# Patient Record
Sex: Male | Born: 1960 | Race: White | Hispanic: No | Marital: Single | Smoking: Never smoker
Health system: Southern US, Community
[De-identification: ages and names within clinical notes are randomized; demographics above are authoritative.]

## PROBLEM LIST (undated history)

## (undated) DIAGNOSIS — Z87442 Personal history of urinary calculi: Secondary | ICD-10-CM

## (undated) DIAGNOSIS — I10 Essential (primary) hypertension: Secondary | ICD-10-CM

## (undated) DIAGNOSIS — M109 Gout, unspecified: Secondary | ICD-10-CM

## (undated) DIAGNOSIS — K5792 Diverticulitis of intestine, part unspecified, without perforation or abscess without bleeding: Secondary | ICD-10-CM

## (undated) DIAGNOSIS — M1990 Unspecified osteoarthritis, unspecified site: Secondary | ICD-10-CM

## (undated) DIAGNOSIS — E663 Overweight: Secondary | ICD-10-CM

## (undated) DIAGNOSIS — E785 Hyperlipidemia, unspecified: Secondary | ICD-10-CM

## (undated) DIAGNOSIS — G4730 Sleep apnea, unspecified: Secondary | ICD-10-CM

## (undated) HISTORY — DX: Diverticulitis of intestine, part unspecified, without perforation or abscess without bleeding: K57.92

## (undated) HISTORY — PX: OTHER SURGICAL HISTORY: SHX169

## (undated) HISTORY — DX: Overweight: E66.3

## (undated) HISTORY — DX: Gout, unspecified: M10.9

## (undated) HISTORY — PX: APPENDECTOMY: SHX54

## (undated) HISTORY — PX: COLONOSCOPY: SHX174

## (undated) HISTORY — DX: Hyperlipidemia, unspecified: E78.5

---
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
## 2004-01-29 ENCOUNTER — Emergency Department: Payer: Self-pay | Admitting: Emergency Medicine

## 2010-05-31 ENCOUNTER — Ambulatory Visit: Payer: Self-pay | Admitting: Unknown Physician Specialty

## 2014-07-24 ENCOUNTER — Inpatient Hospital Stay
Admit: 2014-07-24 | Disposition: A | Discharge status: Home / Self Care | Payer: Self-pay | Attending: Surgery | Admitting: Surgery

## 2014-07-24 LAB — URINALYSIS, COMPLETE
BACTERIA: NONE SEEN
Bilirubin,UR: NEGATIVE
GLUCOSE, UR: NEGATIVE mg/dL (ref 0–75)
Ketone: NEGATIVE
LEUKOCYTE ESTERASE: NEGATIVE
NITRITE: NEGATIVE
Ph: 6 (ref 4.5–8.0)
Protein: NEGATIVE
Specific Gravity: 1.015 (ref 1.003–1.030)
Squamous Epithelial: NONE SEEN

## 2014-07-24 LAB — COMPREHENSIVE METABOLIC PANEL
ALK PHOS: 55 U/L
ANION GAP: 10 (ref 7–16)
Albumin: 3.7 g/dL
BUN: 17 mg/dL
Bilirubin,Total: 1.6 mg/dL — ABNORMAL HIGH
CALCIUM: 8.6 mg/dL — AB
CO2: 25 mmol/L
Chloride: 97 mmol/L — ABNORMAL LOW
Creatinine: 1.24 mg/dL
EGFR (African American): >60
EGFR (Non-African Amer.): >60
GLUCOSE: 137 mg/dL — AB
POTASSIUM: 3.5 mmol/L
SGOT(AST): 21 U/L
SGPT (ALT): 23 U/L
SODIUM: 132 mmol/L — AB
Total Protein: 7.4 g/dL

## 2014-07-24 LAB — CBC WITH DIFFERENTIAL/PLATELET
Basophil #: 0.1 10*3/uL (ref 0.0–0.1)
Basophil %: 0.3 %
Eosinophil #: 0 10*3/uL (ref 0.0–0.7)
Eosinophil %: 0.2 %
HCT: 43.7 % (ref 40.0–52.0)
HGB: 14.6 g/dL (ref 13.0–18.0)
LYMPHS ABS: 1.8 10*3/uL (ref 1.0–3.6)
Lymphocyte %: 10.7 %
MCH: 29.5 pg (ref 26.0–34.0)
MCHC: 33.4 g/dL (ref 32.0–36.0)
MCV: 88 fL (ref 80–100)
Monocyte #: 1.6 x10 3/mm — ABNORMAL HIGH (ref 0.2–1.0)
Monocyte %: 9.4 %
Neutrophil #: 13.4 10*3/uL — ABNORMAL HIGH (ref 1.4–6.5)
Neutrophil %: 79.4 %
Platelet: 252 10*3/uL (ref 150–440)
RBC: 4.95 10*6/uL (ref 4.40–5.90)
RDW: 12.9 % (ref 11.5–14.5)
WBC: 16.9 10*3/uL — ABNORMAL HIGH (ref 3.8–10.6)

## 2014-07-24 LAB — LIPASE, BLOOD: Lipase: 27 U/L

## 2014-07-25 LAB — CBC WITH DIFFERENTIAL/PLATELET
BASOS ABS: 0.1 10*3/uL (ref 0.0–0.1)
Basophil %: 0.4 %
Eosinophil #: 0.1 10*3/uL (ref 0.0–0.7)
Eosinophil %: 0.9 %
HCT: 40.7 % (ref 40.0–52.0)
HGB: 13.5 g/dL (ref 13.0–18.0)
Lymphocyte #: 1.6 10*3/uL (ref 1.0–3.6)
Lymphocyte %: 11.7 %
MCH: 29.8 pg (ref 26.0–34.0)
MCHC: 33.2 g/dL (ref 32.0–36.0)
MCV: 90 fL (ref 80–100)
MONOS PCT: 9.8 %
Monocyte #: 1.3 x10 3/mm — ABNORMAL HIGH (ref 0.2–1.0)
NEUTROS PCT: 77.2 %
Neutrophil #: 10.6 10*3/uL — ABNORMAL HIGH (ref 1.4–6.5)
PLATELETS: 213 10*3/uL (ref 150–440)
RBC: 4.54 10*6/uL (ref 4.40–5.90)
RDW: 12.7 % (ref 11.5–14.5)
WBC: 13.7 10*3/uL — AB (ref 3.8–10.6)

## 2014-07-25 LAB — BASIC METABOLIC PANEL
Anion Gap: 8 (ref 7–16)
BUN: 16 mg/dL
CO2: 27 mmol/L
CREATININE: 1.28 mg/dL — AB
Calcium, Total: 7.9 mg/dL — ABNORMAL LOW
Chloride: 99 mmol/L — ABNORMAL LOW
EGFR (African American): >60
EGFR (Non-African Amer.): >60
Glucose: 127 mg/dL — ABNORMAL HIGH
Potassium: 3.4 mmol/L — ABNORMAL LOW
Sodium: 134 mmol/L — ABNORMAL LOW

## 2014-07-27 LAB — CBC WITH DIFFERENTIAL/PLATELET
BASOS PCT: 0.8 %
Basophil #: 0.1 10*3/uL (ref 0.0–0.1)
EOS PCT: 3.1 %
Eosinophil #: 0.3 10*3/uL (ref 0.0–0.7)
HCT: 38.7 % — AB (ref 40.0–52.0)
HGB: 12.9 g/dL — ABNORMAL LOW (ref 13.0–18.0)
Lymphocyte #: 1.9 10*3/uL (ref 1.0–3.6)
Lymphocyte %: 19 %
MCH: 30.1 pg (ref 26.0–34.0)
MCHC: 33.4 g/dL (ref 32.0–36.0)
MCV: 90 fL (ref 80–100)
MONO ABS: 1 x10 3/mm (ref 0.2–1.0)
Monocyte %: 9.6 %
NEUTROS ABS: 6.7 10*3/uL — AB (ref 1.4–6.5)
Neutrophil %: 67.5 %
Platelet: 260 10*3/uL (ref 150–440)
RBC: 4.3 10*6/uL — AB (ref 4.40–5.90)
RDW: 12.8 % (ref 11.5–14.5)
WBC: 10 10*3/uL (ref 3.8–10.6)

## 2014-08-07 NOTE — H&P (Addendum)
PATIENT NAME:  Brady Gilbert, Brady Gilbert MR#:  [id number] DATE OF BIRTH:  February 16, 1961  DATE OF ADMISSION:  07/24/2014  PRIMARY CARE PHYSICIAN: [facility], Quita SkyeJames D. Dorothey BasemanStrickland, MD.   ADMITTING PHYSICIAN: Quentin Orealph L. Ely III, MD   CHIEF COMPLAINT: Abdominal pain and fever.   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 He presents with a 1-week history of suprapubic and right lower quadrant abdominal pain. He noted the pain while dressing several days ago with very insidious onset. The pain began to increase over the last 24 to 48 hours. He developed a low-grade fever, has not felt well. He had some mild diarrhea. He has no nausea or vomiting. He has been eating normally. No bloating or indigestion. He denies any other significant GI problems. He has no history of hepatitis, yellow jaundice, pancreatitis, peptic ulcer disease, gallbladder disease, or diverticulitis. His only previous surgery was an appendectomy performed when he was a child. He has had a colonoscopy which demonstrated several small polyps. He is regularly followed by Dr[facility] Marylene BuergerJim Strickland [facility]. His only other major medical problem is hypertension, currently treated with hydrochlorothiazide and losartan combination.  ALLERGIES: He does not have any medical allergies.   SOCIAL HISTORY: He is an occasional cigarette smoker, drinks no alcohol regularly. 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  REVIEW OF SYSTEMS: Otherwise unremarkable. A 10-point review of systems was carried out, and the patient has no other major medical problems. He denies specifically any shortness of breath, chest pain, urinary problems. He does have some left knee problems that limit his activity somewhat but have not required any intervention.   FAMILY HISTORY: Significant for abdominal aortic aneurysm, hypertension, and brain bleed in his mother.   DIAGNOSTIC DATA:  Emergency Room workup revealed an elevated white blood cell count at 16,900. Bilirubin was slightly elevated at 1.6. Sodium was 132, chloride 97, with a potassium of 3.5. CT scan demonstrated what appeared to be significant sigmoid diverticulitis with probable perforation and a small amount of free air. No clear-cut abscess was identified. A gallstone was also noted and the question of impacted duct stone was raised.   PHYSICAL EXAMINATION:  GENERAL: The patient is lying comfortably in bed with no major complaints. His pain is under good control with his current medication.  VITAL SIGNS: Blood pressure is 105/68, temperature is 99.1, heart rate is 97, oxygen saturation is 95% on room air.  HEENT: Reveals normal eyes, normal ears. No scleral icterus, no pupillary abnormalities. LYMPHATIC: Reveals no adenopathy in the cervical or axillary areas.  CHEST: Clear with no adventitious sounds. He has normal pulmonary excursion.  CARDIAC: Reveals no murmurs or gallops to my ear, and he seems to be in normal sinus rhythm. His abdomen is soft with minimal suprapubic tenderness, some mild guarding, and a little bit of referred rebound from the right side. No other masses are noted.  EXTREMITIES: Lower extremity exam reveals full range of motion, as does the upper extremity examination. Good distal pulses are noted. No deformities are noted.  SKIN: Reveals no lesions, no contusions, no abrasions.  NEUROLOGIC: Reveals symmetrical reflexes and motor sensory function.  PSYCHIATRIC: Reveals normal orientation, normal affect.   ASSESSMENT AND PLAN: I have independently reviewed the CT scan. He does appear to have phlegmon around the distal sigmoid colon with a long length of bowel below it that appears to be  uninvolved. There is some free air noted, but no clear-cut defined abscess.   I would recommend admitting this patient to the hospital, starting significant antibiotic therapy, observing his course over the next  24 to 48 hours. I am concerned that surgery at this point would require a colostomy. However, with his CT findings, I think he is probably looking at surgery during this admission. He is 10 days shy of retirement and is very concerned about short hospitalization so he can get his retirement completed and then deal with whatever medical problems might be concerning. We outlined the possibility of perforation, abscess formation, the need for surgery, colostomy formation, even more complicated problems with sepsis, and death. The family was present for the interview, and they are in agreement with the plan.   ____________________________ Carmie End, MD rle:ST D: 07/24/2014 20:54:11 ET T: 07/24/2014 21:42:59 ET JOB#: [id number]  cc: Carmie End, MD, <Dictator> Quita Skye. Dorothey Baseman, MD Quentin Ore MD ELECTRONICALLY SIGNED 08/22/2014 14:27

## 2014-08-08 NOTE — Discharge Summary (Addendum)
PATIENT NAME:  Brady Gilbert, Brady Gilbert MR#:  [id number] DATE OF BIRTH:  10/16/1960  DATE OF ADMISSION:  07/24/2014 DATE OF DISCHARGE:  07/27/2014   FINAL DIAGNOSIS: Complicated sigmoid diverticulitis.   HOSPITAL COURSE SUMMARY:  The patient was admitted with complicated sigmoid diverticulitis. He had a very quick resolution of his symptomatology with intravenous antibiotics. He started passing gas, no nausea, no vomiting. He was continued on intravenous antibiotics on the 19th. His abdomen was soft and nontender. His diet was able to be slowly advanced and he was discharged home on April 20 in stable condition. He will follow up with me in 1 weeks' time.   DISCHARGE MEDICATIONS: Cipro 500 mg by mouth b.i.d. for 10 days, Flagyl 500 mg by mouth every 8 hours for 10 days, losartan 50 mg by mouth once a day, hydrochlorothiazide/losartan mixed tablet 1 tablet by mouth every day.    ____________________________ Redge GainerMark A. Egbert GaribaldiBird, MD mab:sp D: 08/06/2014 15:11:12 ET T: 08/07/2014 08:56:00 ET JOB#: [id number]  cc: Loraine LericheMark A. Egbert GaribaldiBird, MD, <Dictator> Quita SkyeJames D. Dorothey BasemanStrickland, MD Raynald KempMARK A Loraina Stauffer MD ELECTRONICALLY SIGNED 08/07/2014 16:31

## 2014-08-09 NOTE — Discharge Summary (Signed)
PATIENT NAME:  Brady MorelCATOE, [name] MR#:  [id number] DATE OF BIRTH:  [birth year]  DATE OF ADMISSION:  07/24/2014 DATE OF DISCHARGE:  07/27/2014   FINAL DIAGNOSIS: Complicated sigmoid diverticulitis.   HOSPITAL COURSE SUMMARY:  The patient was admitted with complicated sigmoid diverticulitis. He had a very quick resolution of his symptomatology with intravenous antibiotics. He started passing gas, no nausea, no vomiting. He was continued on intravenous antibiotics on the 19th. His abdomen was soft and nontender. His diet was able to be slowly advanced and he was discharged home on April 20 in stable condition. He will follow up with me in 1 weeks time.   DISCHARGE MEDICATIONS: Cipro 500 mg by mouth b.i.d. for 10 days, Flagyl 500 mg by mouth every 8 hours for 10 days, losartan 50 mg by mouth once a day, hydrochlorothiazide/losartan mixed tablet 1 tablet by mouth every day.   ____________________________ Redge GainerMark A. Egbert GaribaldiBird, MD mab:sp D: 08/06/2014 15:11:00 ET T: 08/07/2014 08:56:00 ET JOB#: [id number]  cc: Quita SkyeJames D. Dorothey BasemanStrickland, MD Redge GainerMark A. Egbert GaribaldiBird, MD, <Dictator>   Raynald KempMARK A Eva Griffo MD ELECTRONICALLY SIGNED 08/07/2014 16:31

## 2016-04-07 IMAGING — CT CT ABD-PELV W/ CM
1 of 3 series · 14 of 32 positions shown, 19 images · IV contrast (omnipaque)
Comparison: None.

CLINICAL DATA: Complaint of lower abdominal pain, low grade fever
last several days, constipation, history of appendectomy

EXAM:
CT ABDOMEN AND PELVIS WITH CONTRAST
TECHNIQUE: Multidetector CT imaging of the abdomen and pelvis was performed
using the standard protocol following bolus administration of
intravenous contrast.
CONTRAST:  125 mL Omnipaque 300

[Series 2: routine abd pel with · axial · 0.89mm/px · z∈[-926,-446]mm · 14 of 108 slices shown, 19 images]
[im 6/108  soft-tissue]
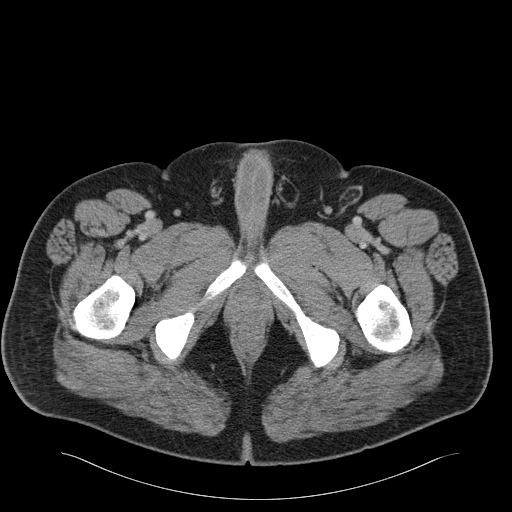
[im 6/108  bone]
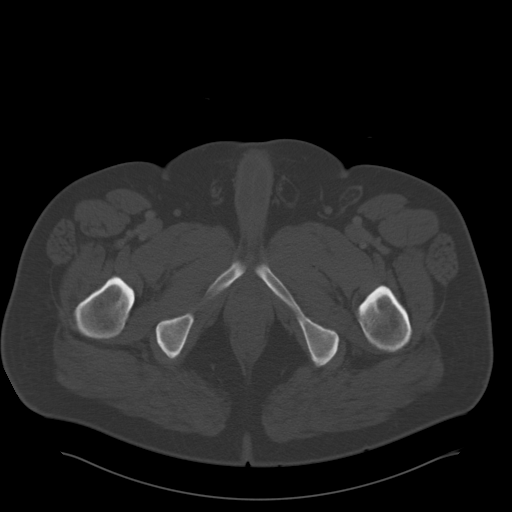
[im 17/108  soft-tissue]
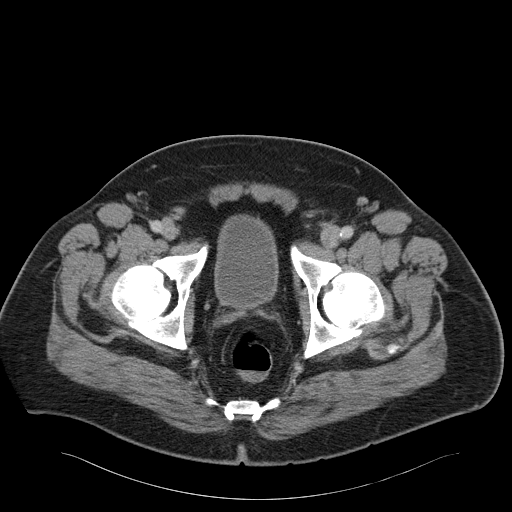
[im 23/108  soft-tissue]
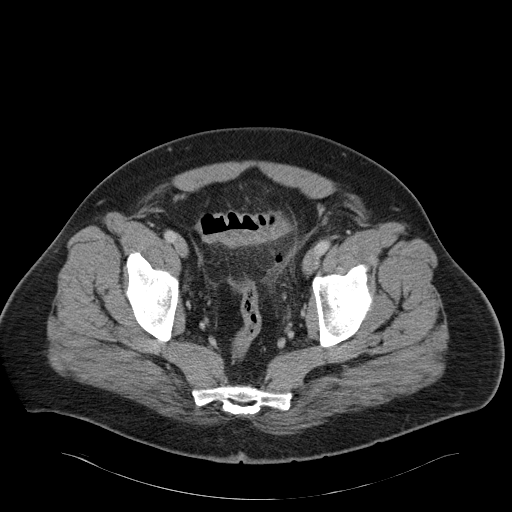
[im 29/108  soft-tissue]
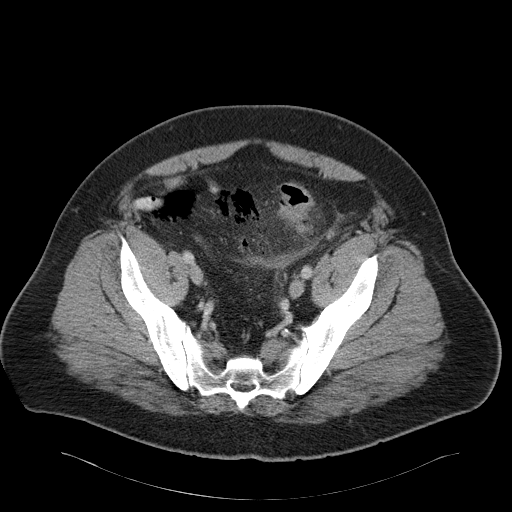
[im 40/108  soft-tissue]
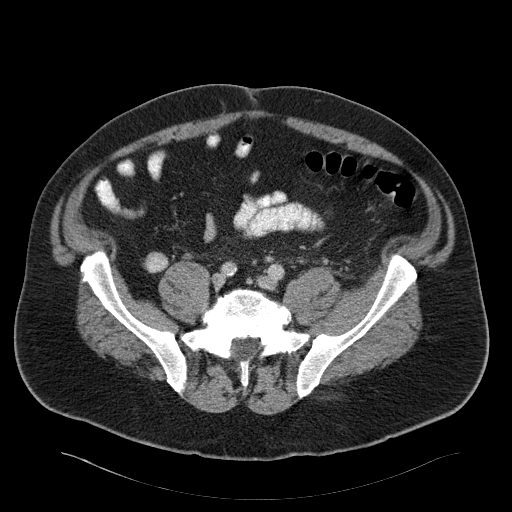
[im 46/108  soft-tissue]
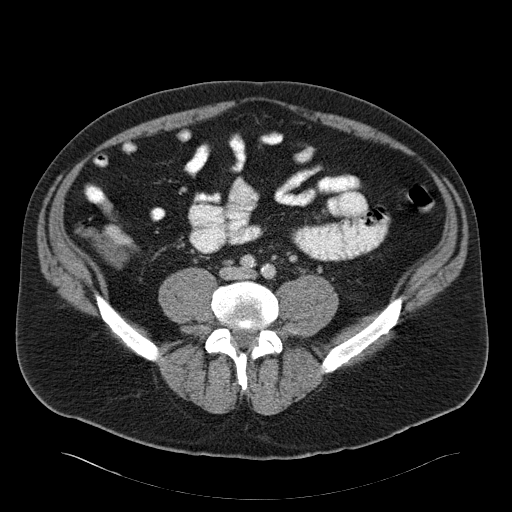
[im 57/108  soft-tissue]
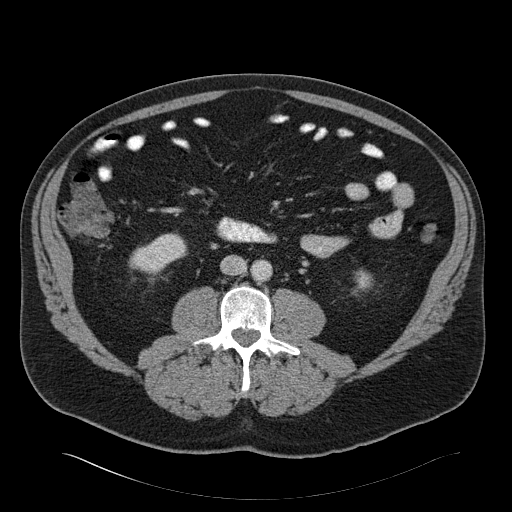
[im 62/108  soft-tissue]
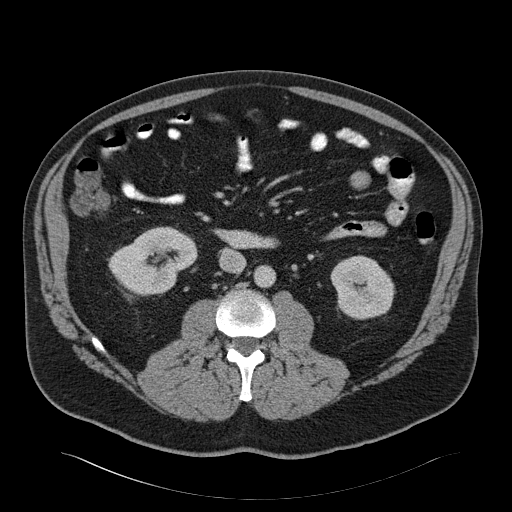
[im 68/108  soft-tissue]
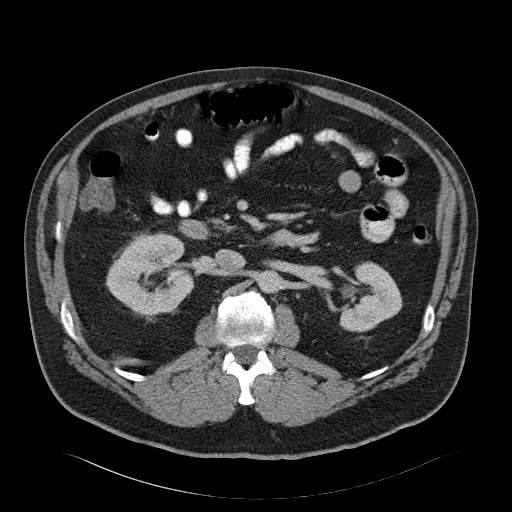
[im 68/108  bone]
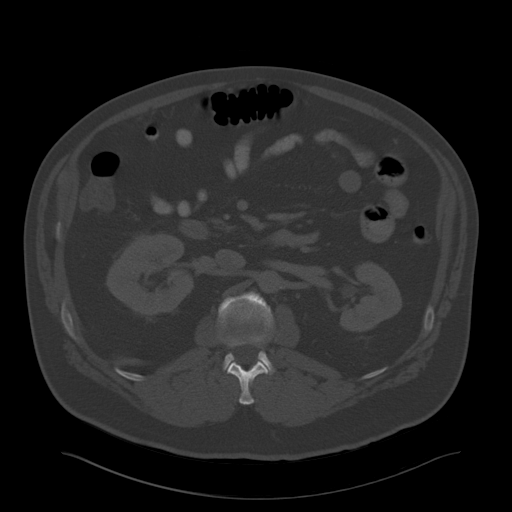
[im 79/108  soft-tissue]
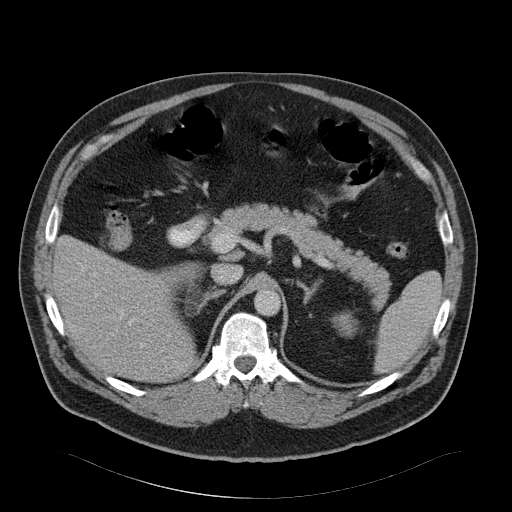
[im 85/108  soft-tissue]
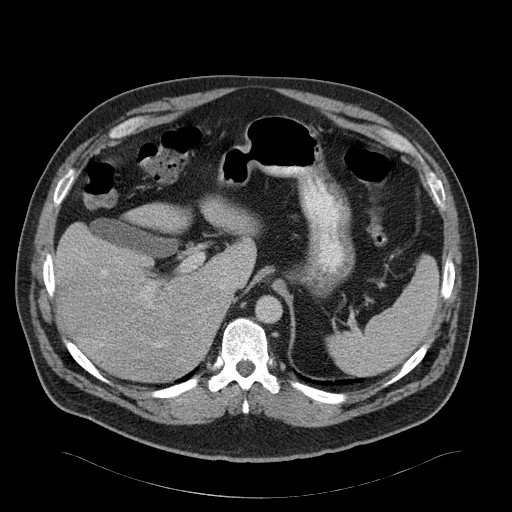
[im 85/108  lung]
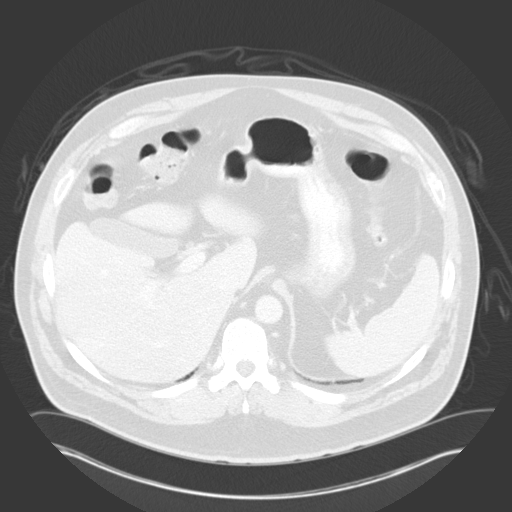
[im 91/108  soft-tissue]
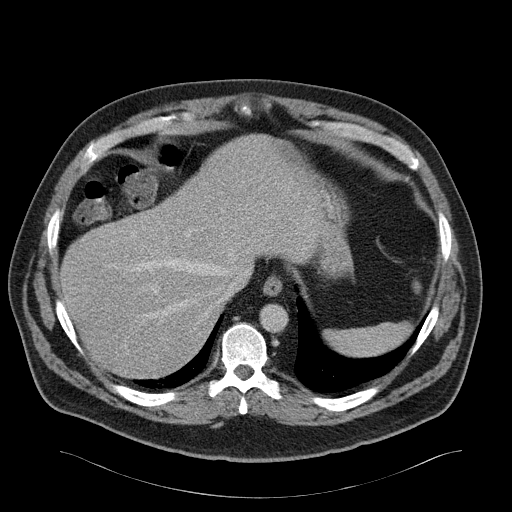
[im 91/108  lung]
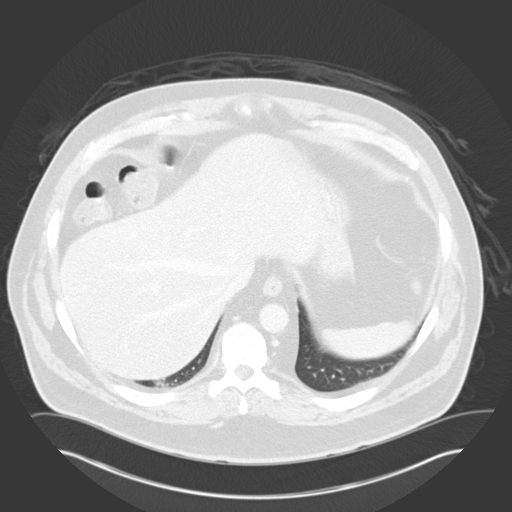
[im 96/108  lung]
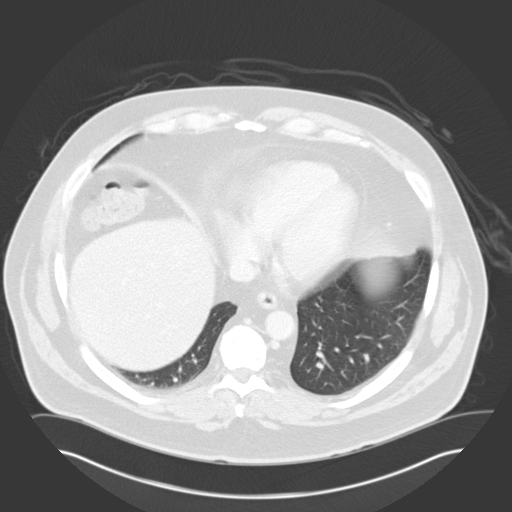
[im 102/108  soft-tissue]
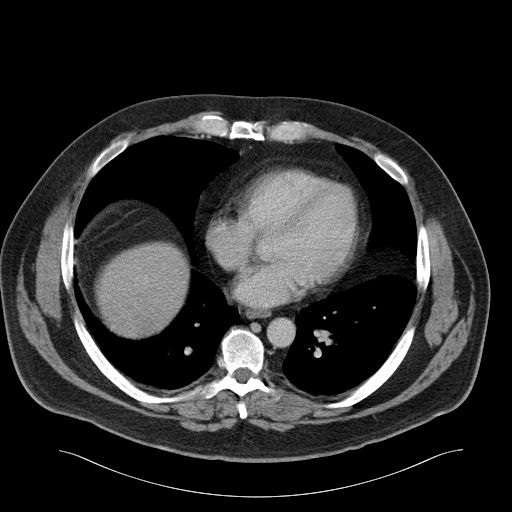
[im 102/108  lung]
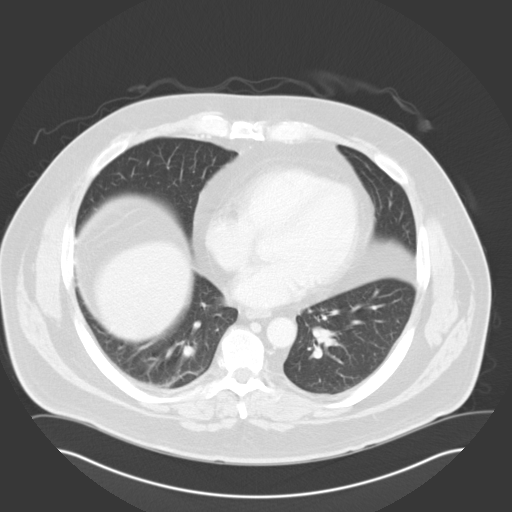

[14 of 32 positions shown; findings below may reference images not displayed]

FINDINGS: Visualized portions of the lung bases clear.

Mild diffuse hepatic steatosis. Possible noncalcified stone in the
neck of the gallbladder. Spleen is normal. Pancreas is normal.

Adrenal glands are normal.  Kidneys are normal.

Stomach and small bowel.

Bladder is normal.  Reproductive organs are normal.

There is mild diverticulosis of the mid to distal sigmoid colon.
There are a few particularly enlarged diverticula. There is moderate
to severe inflammatory change in the mesenteric fat surrounding this
portion of the large bowel. There are numerous foci of extra luminal
air are both anterior and posterior to the sigmoid colon. The
largest pocket of free air is seen anteriorly and measures 5.5 x
cm. There are a few smaller foci of air posteriorly. No abscess is
identified.

No acute musculoskeletal findings.
IMPRESSION: Diverticulitis with evidence of perforation. Critical Value/emergent
results were called by telephone at the time of interpretation on
07/24/2014 at [DATE] to Dr. NM BUTCHER , who verbally acknowledged
these results.

## 2017-10-19 ENCOUNTER — Emergency Department: Payer: BLUE CROSS/BLUE SHIELD

## 2017-10-19 ENCOUNTER — Encounter: Payer: Self-pay | Admitting: Emergency Medicine

## 2017-10-19 ENCOUNTER — Other Ambulatory Visit: Payer: Self-pay

## 2017-10-19 DIAGNOSIS — Z79899 Other long term (current) drug therapy: Secondary | ICD-10-CM | POA: Diagnosis not present

## 2017-10-19 DIAGNOSIS — F17210 Nicotine dependence, cigarettes, uncomplicated: Secondary | ICD-10-CM | POA: Insufficient documentation

## 2017-10-19 DIAGNOSIS — R000 Tachycardia, unspecified: Secondary | ICD-10-CM | POA: Diagnosis present

## 2017-10-19 DIAGNOSIS — R002 Palpitations: Secondary | ICD-10-CM | POA: Diagnosis not present

## 2017-10-19 DIAGNOSIS — I10 Essential (primary) hypertension: Secondary | ICD-10-CM | POA: Diagnosis not present

## 2017-10-19 LAB — BASIC METABOLIC PANEL
ANION GAP: 10 (ref 5–15)
BUN: 24 mg/dL — ABNORMAL HIGH (ref 6–20)
CALCIUM: 9.2 mg/dL (ref 8.9–10.3)
CO2: 25 mmol/L (ref 22–32)
Chloride: 102 mmol/L (ref 98–111)
Creatinine, Ser: 1.48 mg/dL — ABNORMAL HIGH (ref 0.61–1.24)
GFR, EST AFRICAN AMERICAN: 58 mL/min — AB (ref >60)
GFR, EST NON AFRICAN AMERICAN: 50 mL/min — AB (ref >60)
Glucose, Bld: 147 mg/dL — ABNORMAL HIGH (ref 70–99)
POTASSIUM: 3.3 mmol/L — AB (ref 3.5–5.1)
Sodium: 137 mmol/L (ref 135–145)

## 2017-10-19 LAB — CBC
HCT: 45.6 % (ref 40.0–52.0)
Hemoglobin: 16.1 g/dL (ref 13.0–18.0)
MCH: 31.3 pg (ref 26.0–34.0)
MCHC: 35.3 g/dL (ref 32.0–36.0)
MCV: 88.7 fL (ref 80.0–100.0)
Platelets: 239 10*3/uL (ref 150–440)
RBC: 5.14 MIL/uL (ref 4.40–5.90)
RDW: 13.3 % (ref 11.5–14.5)
WBC: 9.9 10*3/uL (ref 3.8–10.6)

## 2017-10-19 LAB — TROPONIN I

## 2017-10-19 NOTE — ED Triage Notes (Signed)
Pt arrives ambulatory to ED with steady gait for tachycardia. Pt reports hx of HTN and hx of tachycardia. Pt is in NAD.

## 2017-10-20 ENCOUNTER — Emergency Department
Admission: EM | Admit: 2017-10-20 | Discharge: 2017-10-20 | Disposition: A | Discharge status: Home / Self Care | Payer: BLUE CROSS/BLUE SHIELD | Attending: Emergency Medicine | Admitting: Emergency Medicine

## 2017-10-20 DIAGNOSIS — R002 Palpitations: Secondary | ICD-10-CM

## 2017-10-20 DIAGNOSIS — R000 Tachycardia, unspecified: Secondary | ICD-10-CM

## 2017-10-20 HISTORY — DX: Essential (primary) hypertension: I10

## 2017-10-20 LAB — MAGNESIUM: Magnesium: 2.3 mg/dL (ref 1.7–2.4)

## 2017-10-20 LAB — TSH: TSH: 4.095 u[IU]/mL (ref 0.350–4.500)

## 2017-10-20 LAB — TROPONIN I: Troponin I: <0.03 ng/mL (ref <0.03)

## 2017-10-20 MED ORDER — SODIUM CHLORIDE 0.9 % IV BOLUS
1000.0000 mL | Freq: Once | INTRAVENOUS | Status: AC
  Administered 2017-10-20: 1000 mL via INTRAVENOUS

## 2017-10-20 NOTE — ED Notes (Signed)
Patient up to stat desk asking about wait time.  Patient given an update, verbalized understanding.

## 2017-10-20 NOTE — ED Notes (Signed)
ED Provider at bedside.  Pt reports hx of rapid heart rate, pt reports playing 18 holes of golf today and reports good hydration, pt takes Losartan-HCTZ for HTN, reports no medical problems

## 2017-10-20 NOTE — ED Provider Notes (Signed)
[facility] Emergency Department Provider Note   ____________________________________________   First MD Initiated Contact with Patient 10/20/17 [phone]     (approximate)  I have reviewed the triage vital signs and the nursing notes.   HISTORY  Chief Complaint Tachycardia    HPI Brady Gilbert is a 57 y.o. male who comes into the hospital today with a fast heart rate.  The patient states that his heart rate would not go down.  Typically it in the 90s and he noticed that it is always in the 90s.  He has had this for the last several weeks.  He states that he was sitting quietly and noticed it.  As he was realizing his heart rate was fast he states that it was going faster.  The patient is a retired IT sales professionalfirefighter and he states that he does not like that his resting heart rate is so fast.  He denies any chest pain, shortness of breath, sweats.  He reports that he was checking his heart rate on his phone but whenever he get up his heart rate be faster.  He states that he has been drinking and eating without any difficulties.  He is here today for evaluation.   Past Medical History:  Diagnosis Date  . Hypertension     There are no active problems to display for this patient.   Past Surgical History:  Procedure Laterality Date  . APPENDECTOMY    . lasix      Prior to Admission medications   Medication Sig Start Date End Date Taking? Authorizing Provider  losartan-hydrochlorothiazide (HYZAAR) 100-12.5 MG tablet Take 1 tablet by mouth daily.   Yes [provider]    Allergies Patient has no known allergies.  No family history on file.  Social History Social History   Tobacco Use  . Smoking status: Light Tobacco Smoker  . Smokeless tobacco: Current User    Types: Chew  Substance Use Topics  . Alcohol use: Not Currently    Comment: rarely  . Drug use: Never    Review of Systems  Constitutional: No fever/chills Eyes: No visual  changes. ENT: No sore throat. Cardiovascular: Palpitations Respiratory: Denies shortness of breath. Gastrointestinal: No abdominal pain.  No nausea, no vomiting.  No diarrhea.  No constipation. Genitourinary: Negative for dysuria. Musculoskeletal: Negative for back pain. Skin: Negative for rash. Neurological: Negative for headaches, focal weakness or numbness.   ____________________________________________   PHYSICAL EXAM:  VITAL SIGNS: ED Triage Vitals  Enc Vitals Group     BP 10/19/17 2242 135/87     Pulse Rate 10/19/17 2242 (!) 115     Resp 10/19/17 2242 18     Temp 10/19/17 2242 98.3 F (36.8 C)     Temp Source 10/19/17 2242 Oral     SpO2 10/19/17 2242 98 %     Weight 10/19/17 2243 (!) 309 lb (140.2 kg)     Height 10/19/17 2243 6\' 2"  (1.88 m)     Head Circumference --      Peak Flow --      Pain Score 10/19/17 2243 0     Pain Loc --      Pain Edu? --      Excl. in GC? --     Constitutional: Alert and oriented. Well appearing and in mild distress. Eyes: Conjunctivae are normal. PERRL. EOMI. Head: Atraumatic. Nose: No congestion/rhinnorhea. Mouth/Throat: Mucous membranes are moist.  Oropharynx non-erythematous. Cardiovascular: Normal rate, regular rhythm. Grossly normal heart  sounds.  Good peripheral circulation. Respiratory: Normal respiratory effort.  No retractions. Lungs CTAB. Gastrointestinal: Soft and nontender. No distention. No abdominal bruits. No CVA tenderness. Musculoskeletal: No lower extremity tenderness nor edema.   Neurologic:  Normal speech and language. Skin:  Skin is warm, dry and intact.  Psychiatric: Mood and affect are normal.   ____________________________________________   LABS (all labs ordered are listed, but only abnormal results are displayed)  Labs Reviewed  BASIC METABOLIC PANEL - Abnormal; Notable for the following components:      Result Value   Potassium 3.3 (*)    Glucose, Bld 147 (*)    BUN 24 (*)    Creatinine, Ser 1.48  (*)    GFR calc non Af Amer 50 (*)    GFR calc Af Amer 58 (*)    All other components within normal limits  CBC  TROPONIN I  MAGNESIUM  TSH  TROPONIN I   ____________________________________________  EKG  ED ECG REPORT I, Rebecka Apley, the attending physician, personally viewed and interpreted this ECG.   Date: 10/19/2017  EKG Time: 2252  Rate: 98  Rhythm: normal sinus rhythm  Axis: normal  Intervals:none  ST&T Change: none  ____________________________________________  RADIOLOGY  ED MD interpretation: Chest x-ray: Low lung volumes with increased density along heart border  Official radiology report(s): Dg Chest 2 View  Result Date: 10/19/2017 CLINICAL DATA:  Tachycardia.  Hypertension.  Light smoker. EXAM: CHEST - 2 VIEW COMPARISON:  None. FINDINGS: Midline trachea. Mild cardiomegaly. Mediastinal contours otherwise within normal limits. No pleural effusion or pneumothorax. Lung volumes are low. There is mild to moderate right hemidiaphragm elevation. Density along the left heart border on the frontal radiograph is not well localized on the lateral. Diffuse peribronchial thickening. IMPRESSION: Low lung volumes with increased density along the left heart border. This is not localized on the lateral view. Possibly related to lingular volume loss and atelectasis. If there are prior radiographs for comparison, these would be useful. Especially if there are symptoms to suggest left-sided pneumonia, consider radiographic follow-up in 5-7 days. Peribronchial thickening which may relate to chronic bronchitis or smoking. Electronically Signed   By: Jeronimo Greaves M.D.   On: 10/19/2017 23:32    ____________________________________________   PROCEDURES  Procedure(s) performed: None  Procedures  Critical Care performed: No  ____________________________________________   INITIAL IMPRESSION / ASSESSMENT AND PLAN / ED COURSE  As part of my medical decision making, I  reviewed the following data within the electronic MEDICAL RECORD NUMBER Notes from prior ED visits and [place] Controlled Substance Database   This is a 57 year old male who comes into the hospital today with some palpitations.  The patient was concerned as his heart rate was faster than he would like.  He had no chest pain or shortness of breath.  We did check some blood work to include a CBC, BMP, troponin, magnesium, TSH.  The patient's blood work is unremarkable.  His creatinine is 1.48 but it was elevated 3 years ago.  I did encourage the patient to increase his cardiovascular exercise.  Otherwise the patient has no further complaints or concerns.  He will be discharged home and encouraged to follow-up with the acute care clinic or his primary care physician.      ____________________________________________   FINAL CLINICAL IMPRESSION(S) / ED DIAGNOSES  Final diagnoses:  Palpitations  Tachycardia     ED Discharge Orders    None       Note:  This  document was prepared using Conservation officer, historic buildings and may include unintentional dictation errors.    Rebecka Apley, MD 10/20/17 [phone]

## 2017-10-20 NOTE — Discharge Instructions (Addendum)
Your work-up today is unremarkable.  Please follow-up with your primary care physician.  Please increase her cardiovascular exercise as well.  Please return with any pain or any other concerns.

## 2017-10-20 NOTE — ED Notes (Signed)
Pt waiting patiently with 2 visitors for treatment room

## 2017-11-18 DIAGNOSIS — H02402 Unspecified ptosis of left eyelid: Secondary | ICD-10-CM | POA: Insufficient documentation

## 2017-11-18 DIAGNOSIS — H5340 Unspecified visual field defects: Secondary | ICD-10-CM | POA: Insufficient documentation

## 2017-11-18 DIAGNOSIS — H02839 Dermatochalasis of unspecified eye, unspecified eyelid: Secondary | ICD-10-CM | POA: Insufficient documentation

## 2017-11-24 DIAGNOSIS — R002 Palpitations: Secondary | ICD-10-CM | POA: Insufficient documentation

## 2017-11-24 DIAGNOSIS — R079 Chest pain, unspecified: Secondary | ICD-10-CM | POA: Insufficient documentation

## 2017-12-07 HISTORY — PX: BELPHAROPTOSIS REPAIR: SHX369

## 2018-03-24 ENCOUNTER — Other Ambulatory Visit: Payer: Self-pay | Admitting: Family Medicine

## 2018-11-12 ENCOUNTER — Telehealth: Payer: Self-pay

## 2018-11-12 NOTE — Telephone Encounter (Signed)
Pt called and would like Ann to give him a call back

## 2018-11-12 NOTE — Telephone Encounter (Signed)
Spoke with Brady Gilbert (Retired) & doesn't currently work at [employer].  His son who is a Marine scientist at hospital in [place] developed a little cough & he followed his employer's COVID protocol - reported the cough & was tested for COVID.  He was notified Saturday that test results are positive.  Brady Gilbert was at the son's home on the weekend working on a porch - he was there along with son, wife & 64 old child & the other set of grandparents.  The son came to [place] home (here in [place]) on Sunday to isolate away from wife & child.  The son & daughter-in-law want all the grandparents to get COVID testing.  The wife was tested & she is negative.  Brady Gilbert the [facility] phone number & advised him to contact them for guidance on testing.  AMD

## 2018-11-12 NOTE — Telephone Encounter (Signed)
Agree with contacting the HD, with contact tracing in this case challenged by the positive case being in another county.  Based on the history, I do think he should be tested, and hope they recommend that. If he cannot get in touch with the HD for recommendations today (would call later today and inquire), I would direct him to the [facility] drive through for testing with our recommendation to get tested before the weekend.

## 2018-11-12 NOTE — Telephone Encounter (Signed)
Spoke with Brady Gilbert.  States son has been in contact with HD from the county he lives in & they recommend that he be tested on Monday. States he plans to go to [facility] on Monday for testing.  AMD

## 2018-11-16 ENCOUNTER — Other Ambulatory Visit: Payer: Self-pay

## 2018-11-16 DIAGNOSIS — Z20822 Contact with and (suspected) exposure to covid-19: Secondary | ICD-10-CM

## 2018-11-16 NOTE — Telephone Encounter (Signed)
No sx as of today and going to get tested today.

## 2018-11-17 LAB — NOVEL CORONAVIRUS, NAA: SARS-CoV-2, NAA: NOT DETECTED

## 2018-11-18 NOTE — Telephone Encounter (Signed)
COVID test neg, left message on patients machine can call back if question.

## 2019-01-06 ENCOUNTER — Other Ambulatory Visit: Payer: Self-pay

## 2019-01-06 DIAGNOSIS — Z20822 Contact with and (suspected) exposure to covid-19: Secondary | ICD-10-CM

## 2019-01-07 LAB — NOVEL CORONAVIRUS, NAA: SARS-CoV-2, NAA: DETECTED — AB

## 2019-02-18 ENCOUNTER — Other Ambulatory Visit: Payer: Self-pay

## 2019-02-18 ENCOUNTER — Ambulatory Visit: Payer: Self-pay

## 2019-02-18 DIAGNOSIS — Z0000 Encounter for general adult medical examination without abnormal findings: Secondary | ICD-10-CM

## 2019-02-18 LAB — POCT URINALYSIS DIPSTICK
Bilirubin, UA: NEGATIVE
Blood, UA: NEGATIVE
Glucose, UA: NEGATIVE
Ketones, UA: NEGATIVE
Leukocytes, UA: NEGATIVE
Nitrite, UA: NEGATIVE
Protein, UA: NEGATIVE
Spec Grav, UA: 1.01 (ref 1.010–1.025)
Urobilinogen, UA: 0.2 E.U./dL
pH, UA: 6 (ref 5.0–8.0)

## 2019-02-18 NOTE — Progress Notes (Signed)
Scheduled to complete annual Physical & DOT Physical with Gerarda Fraction, NP-C (Interim Provider) on 03/03/2019.  AMD

## 2019-02-19 LAB — CMP12+LP+TP+TSH+6AC+PSA+CBC?
ALT: 17 IU/L (ref 0–44)
AST: 15 IU/L (ref 0–40)
BUN: 16 mg/dL (ref 6–24)
Basophils Absolute: 0.1 10*3/uL (ref 0.0–0.2)
Basos: 1 %
Chol/HDL Ratio: 4.9 ratio (ref 0.0–5.0)
Cholesterol, Total: 196 mg/dL (ref 100–199)
Creatinine, Ser: 1.26 mg/dL (ref 0.76–1.27)
Eos: 2 %
Estimated CHD Risk: 1 times avg. (ref 0.0–1.0)
Free Thyroxine Index: 1.9 (ref 1.2–4.9)
GFR calc non Af Amer: 62 mL/min/{1.73_m2} (ref >59)
Hematocrit: 47.2 % (ref 37.5–51.0)
Hemoglobin: 16.2 g/dL (ref 13.0–17.7)
Lymphs: 39 %
MCH: 30.1 pg (ref 26.6–33.0)
MCHC: 34.3 g/dL (ref 31.5–35.7)
MCV: 88 fL (ref 79–97)
Monocytes: 9 %
Neutrophils: 49 %
Phosphorus: 2.8 mg/dL (ref 2.8–4.1)
T3 Uptake Ratio: 27 % (ref 24–39)
T4, Total: 7.2 ug/dL (ref 4.5–12.0)
Total Protein: 6.5 g/dL (ref 6.0–8.5)
Uric Acid: 6.8 mg/dL (ref 3.7–8.6)
WBC: 6.8 10*3/uL (ref 3.4–10.8)

## 2019-02-19 LAB — CMP12+LP+TP+TSH+6AC+PSA+CBC…
Albumin/Globulin Ratio: 1.6 (ref 1.2–2.2)
Albumin: 4 g/dL (ref 3.8–4.9)
Alkaline Phosphatase: 75 IU/L (ref 39–117)
BUN/Creatinine Ratio: 13 (ref 9–20)
Bilirubin Total: 0.4 mg/dL (ref 0.0–1.2)
Calcium: 9.2 mg/dL (ref 8.7–10.2)
Chloride: 105 mmol/L (ref 96–106)
EOS (ABSOLUTE): 0.2 10*3/uL (ref 0.0–0.4)
GFR calc Af Amer: 72 mL/min/{1.73_m2} (ref >59)
GGT: 16 IU/L (ref 0–65)
Globulin, Total: 2.5 g/dL (ref 1.5–4.5)
Glucose: 100 mg/dL — ABNORMAL HIGH (ref 65–99)
HDL: 40 mg/dL (ref >39)
Immature Grans (Abs): 0 10*3/uL (ref 0.0–0.1)
Immature Granulocytes: 0 %
Iron: 83 ug/dL (ref 38–169)
LDH: 152 IU/L (ref 121–224)
LDL Chol Calc (NIH): 134 mg/dL — ABNORMAL HIGH (ref 0–99)
Lymphocytes Absolute: 2.7 10*3/uL (ref 0.7–3.1)
Monocytes Absolute: 0.6 10*3/uL (ref 0.1–0.9)
Neutrophils Absolute: 3.3 10*3/uL (ref 1.4–7.0)
Platelets: 246 10*3/uL (ref 150–450)
Potassium: 4.5 mmol/L (ref 3.5–5.2)
Prostate Specific Ag, Serum: 0.5 ng/mL (ref 0.0–4.0)
RBC: 5.38 x10E6/uL (ref 4.14–5.80)
RDW: 13 % (ref 11.6–15.4)
Sodium: 140 mmol/L (ref 134–144)
TSH: 2.34 u[IU]/mL (ref 0.450–4.500)
Triglycerides: 123 mg/dL (ref 0–149)
VLDL Cholesterol Cal: 22 mg/dL (ref 5–40)

## 2019-02-20 ENCOUNTER — Encounter: Payer: Self-pay | Admitting: Registered Nurse

## 2019-02-22 ENCOUNTER — Telehealth: Payer: Self-pay

## 2019-02-22 NOTE — Telephone Encounter (Signed)
[facility] [employer] Customer Service at [phone] to add  Test # [phone] Hgb A1c per Gerarda Fraction, NP-C's request.  AMD

## 2019-02-22 NOTE — Telephone Encounter (Signed)
noted 

## 2019-02-27 LAB — SPECIMEN STATUS REPORT

## 2019-02-27 LAB — HGB A1C W/O EAG: Hgb A1c MFr Bld: 5.7 % — ABNORMAL HIGH (ref 4.8–5.6)

## 2019-02-27 NOTE — Telephone Encounter (Signed)
Testosterone results still not available in Epic.  Please follow up with [facility].

## 2019-03-01 NOTE — Telephone Encounter (Signed)
Noted normal will notify patient at appt this week.

## 2019-03-01 NOTE — Telephone Encounter (Signed)
PSA is 0.5 - It resulted since you sent your message.  AMD

## 2019-03-03 ENCOUNTER — Ambulatory Visit: Payer: Self-pay | Admitting: Registered Nurse

## 2019-03-03 ENCOUNTER — Other Ambulatory Visit: Payer: Self-pay

## 2019-03-03 ENCOUNTER — Encounter: Payer: Self-pay | Admitting: Registered Nurse

## 2019-03-03 VITALS — BP 126/80 | HR 70 | Temp 97.2°F | Resp 16 | Ht 74.0 in | Wt 273.0 lb

## 2019-03-03 DIAGNOSIS — K439 Ventral hernia without obstruction or gangrene: Secondary | ICD-10-CM

## 2019-03-03 DIAGNOSIS — R7303 Prediabetes: Secondary | ICD-10-CM

## 2019-03-03 DIAGNOSIS — I10 Essential (primary) hypertension: Secondary | ICD-10-CM

## 2019-03-03 DIAGNOSIS — Z6835 Body mass index (BMI) 35.0-35.9, adult: Secondary | ICD-10-CM

## 2019-03-03 DIAGNOSIS — Z024 Encounter for examination for driving license: Secondary | ICD-10-CM

## 2019-03-03 DIAGNOSIS — E785 Hyperlipidemia, unspecified: Secondary | ICD-10-CM

## 2019-03-03 NOTE — Patient Instructions (Addendum)
Ventral Hernia  A ventral hernia is a bulge of tissue from inside the abdomen that pushes through a weak area of the muscles that form the front wall of the abdomen. The tissues inside the abdomen are inside a sac (peritoneum). These tissues include the small intestine, large intestine, and the fatty tissue that covers the intestines (omentum). Sometimes, the bulge that forms a hernia contains intestines. Other hernias contain only fat. Ventral hernias do not go away without surgical treatment. There are several types of ventral hernias. You may have:  A hernia at an incision site from previous abdominal surgery (incisional hernia).  A hernia just above the belly button (epigastric hernia), or at the belly button (umbilical hernia). These types of hernias can develop from heavy lifting or straining.  A hernia that comes and goes (reducible hernia). It may be visible only when you lift or strain. This type of hernia can be pushed back into the abdomen (reduced).  A hernia that traps abdominal tissue inside the hernia (incarcerated hernia). This type of hernia does not reduce.  A hernia that cuts off blood flow to the tissues inside the hernia (strangulated hernia). The tissues can start to die if this happens. This is a very painful bulge that cannot be reduced. A strangulated hernia is a medical emergency. What are the causes? This condition is caused by abdominal tissue putting pressure on an area of weakness in the abdominal muscles. What increases the risk? The following factors may make you more likely to develop this condition:  Being male.  Being 60 or older.  Being overweight or obese.  Having had previous abdominal surgery, especially if there was an infection after surgery.  Having had an injury to the abdominal wall.  Having had several pregnancies.  Having a buildup of fluid inside the abdomen (ascites). What are the signs or symptoms? The only symptom of a ventral hernia  may be a painless bulge in the abdomen. A reducible hernia may be visible only when you strain, cough, or lift. Other symptoms may include:  Dull pain.  A feeling of pressure. Signs and symptoms of a strangulated hernia may include:  Increasing pain.  Nausea and vomiting.  Pain when pressing on the hernia.  The skin over the hernia turning red or purple.  Constipation.  Blood in the stool (feces). How is this diagnosed? This condition may be diagnosed based on:  Your symptoms.  Your medical history.  A physical exam. You may be asked to cough or strain while standing. These actions increase the pressure inside your abdomen and force the hernia through the opening in your muscles. Your health care provider may try to reduce the hernia by pressing on it.  Imaging studies, such as an ultrasound or CT scan. How is this treated? This condition is treated with surgery. If you have a strangulated hernia, surgery is done as soon as possible. If your hernia is small and not incarcerated, you may be asked to lose some weight before surgery. Follow these instructions at home:  Follow instructions from your health care provider about eating or drinking restrictions.  If you are overweight, your health care provider may recommend that you increase your activity level and eat a healthier diet.  Do not lift anything that is heavier than 10 lb (4.5 kg).  Return to your normal activities as told by your health care provider. Ask your health care provider what activities are safe for you. You may need to avoid activities   that increase pressure on your hernia.  Take over-the-counter and prescription medicines only as told by your health care provider.  Keep all follow-up visits as told by your health care provider. This is important. Contact a health care provider if:  Your hernia gets larger.  Your hernia becomes painful. Get help right away if:  Your hernia becomes increasingly  painful.  You have pain along with any of the following: ? Changes in skin color in the area of the hernia. ? Nausea. ? Vomiting. ? Fever. Summary  A ventral hernia is a bulge of tissue from inside the abdomen that pushes through a weak area of the muscles that form the front wall of the abdomen.  This condition is treated with surgery, which may be urgent depending on your hernia.  Do not lift anything that is heavier than 10 lb (4.5 kg), and follow activity instructions from your health care provider. This information is not intended to replace advice given to you by your health care provider. Make sure you discuss any questions you have with your health care provider. Document Released: 03/11/2012 Document Revised: 05/07/2017 Document Reviewed: 10/14/2016 Elsevier Patient Education  2020 [employer]. Prediabetes Prediabetes is the condition of having a blood sugar (blood glucose) level that is higher than it should be, but not high enough for you to be diagnosed with type 2 diabetes. Having prediabetes puts you at risk for developing type 2 diabetes (type 2 diabetes mellitus). Prediabetes may be called impaired glucose tolerance or impaired fasting glucose. Prediabetes usually does not cause symptoms. Your health care provider can diagnose this condition with blood tests. You may be tested for prediabetes if you are overweight and if you have at least one other risk factor for prediabetes. What is blood glucose, and how is it measured? Blood glucose refers to the amount of glucose in your bloodstream. Glucose comes from eating foods that contain sugars and starches (carbohydrates), which the body breaks down into glucose. Your blood glucose level may be measured in mg/dL (milligrams per deciliter) or mmol/L (millimoles per liter). Your blood glucose may be checked with one or more of the following blood tests:  A fasting blood glucose (FBG) test. You will not be allowed to eat (you will  fast) for 8 hours or longer before a blood sample is taken. ? A normal range for FBG is 70-100 mg/dl (1.6-1.0 mmol/L).  An A1c (hemoglobin A1c) blood test. This test provides information about blood glucose control over the previous 2?79months.  An oral glucose tolerance test (OGTT). This test measures your blood glucose at two times: ? After fasting. This is your baseline level. ? Two hours after you drink a beverage that contains glucose. You may be diagnosed with prediabetes:  If your FBG is 100?125 mg/dL (9.6-0.4 mmol/L).  If your A1c level is 5.7?6.4%.  If your OGTT result is 140?199 mg/dL (5.4-09 mmol/L). These blood tests may be repeated to confirm your diagnosis. How can this condition affect me? The pancreas produces a hormone (insulin) that helps to move glucose from the bloodstream into cells. When cells in the body do not respond properly to insulin that the body makes (insulin resistance), excess glucose builds up in the blood instead of going into cells. As a result, high blood glucose (hyperglycemia) can develop, which can cause many complications. Hyperglycemia is a symptom of prediabetes. Having high blood glucose for a long time is dangerous. Too much glucose in your blood can damage your nerves and blood  vessels. Long-term damage can lead to complications from diabetes, which may include:  Heart disease.  Stroke.  Blindness.  Kidney disease.  Depression.  Poor circulation in the feet and legs, which could lead to surgical removal (amputation) in severe cases. What can increase my risk? Risk factors for prediabetes include:  Having a family member with type 2 diabetes.  Being overweight or obese.  Being older than age 74.  Being of American [place], African-American, Hispanic/Latino, or Asian/Pacific Islander descent.  Having an inactive (sedentary) lifestyle.  Having a history of heart disease.  History of gestational diabetes or polycystic ovary  syndrome (PCOS), in women.  Having low levels of good cholesterol (HDL-C) or high levels of blood fats (triglycerides).  Having high blood pressure. What actions can I take to prevent diabetes?      Be physically active. ? Do moderate-intensity physical activity for 30 or more minutes on 5 or more days of the week, or as much as told by your health care provider. This could be brisk walking, biking, or water aerobics. ? Ask your health care provider what activities are safe for you. A mix of physical activities may be best, such as walking, swimming, cycling, and strength training.  Lose weight as told by your health care provider. ? Losing 5-7% of your body weight can reverse insulin resistance. ? Your health care provider can determine how much weight loss is best for you and can help you lose weight safely.  Follow a healthy meal plan. This includes eating lean proteins, complex carbohydrates, fresh fruits and vegetables, low-fat dairy products, and healthy fats. ? Follow instructions from your health care provider about eating or drinking restrictions. ? Make an appointment to see a diet and nutrition specialist (registered dietitian) to help you create a healthy eating plan that is right for you.  Do not smoke or use any tobacco products, such as cigarettes, chewing tobacco, and e-cigarettes. If you need help quitting, ask your health care provider.  Take over-the-counter and prescription medicines as told by your health care provider. You may be prescribed medicines that help lower the risk of type 2 diabetes.  Keep all follow-up visits as told by your health care provider. This is important. Summary  Prediabetes is the condition of having a blood sugar (blood glucose) level that is higher than it should be, but not high enough for you to be diagnosed with type 2 diabetes.  Having prediabetes puts you at risk for developing type 2 diabetes (type 2 diabetes mellitus).  To help  prevent type 2 diabetes, make lifestyle changes such as being physically active and eating a healthy diet. Lose weight as told by your health care provider. This information is not intended to replace advice given to you by your health care provider. Make sure you discuss any questions you have with your health care provider. Document Released: 07/17/2015 Document Revised: 07/17/2018 Document Reviewed: 05/16/2015 Elsevier Patient Education  [place]. High Cholesterol  High cholesterol is a condition in which the blood has high levels of a white, waxy, fat-like substance (cholesterol). The human body needs small amounts of cholesterol. The liver makes all the cholesterol that the body needs. Extra (excess) cholesterol comes from the food that we eat. Cholesterol is carried from the liver by the blood through the blood vessels. If you have high cholesterol, deposits (plaques) may build up on the walls of your blood vessels (arteries). Plaques make the arteries narrower and stiffer. Cholesterol plaques increase your  risk for heart attack and stroke. Work with your health care provider to keep your cholesterol levels in a healthy range. What increases the risk? This condition is more likely to develop in people who:  Eat foods that are high in animal fat (saturated fat) or cholesterol.  Are overweight.  Are not getting enough exercise.  Have a family history of high cholesterol. What are the signs or symptoms? There are no symptoms of this condition. How is this diagnosed? This condition may be diagnosed from the results of a blood test.  If you are older than age 23, your health care provider may check your cholesterol every 4-6 years.  You may be checked more often if you already have high cholesterol or other risk factors for heart disease. The blood test for cholesterol measures:  "Bad" cholesterol (LDL cholesterol). This is the main type of cholesterol that causes heart  disease. The desired level for LDL is less than 100.  "Good" cholesterol (HDL cholesterol). This type helps to protect against heart disease by cleaning the arteries and carrying the LDL away. The desired level for HDL is 60 or higher.  Triglycerides. These are fats that the body can store or burn for energy. The desired number for triglycerides is lower than 150.  Total cholesterol. This is a measure of the total amount of cholesterol in your blood, including LDL cholesterol, HDL cholesterol, and triglycerides. A healthy number is less than 200. How is this treated? This condition is treated with diet changes, lifestyle changes, and medicines. Diet changes  This may include eating more whole grains, fruits, vegetables, nuts, and fish.  This may also include cutting back on red meat and foods that have a lot of added sugar. Lifestyle changes  Changes may include getting at least 40 minutes of aerobic exercise 3 times a week. Aerobic exercises include walking, biking, and swimming. Aerobic exercise along with a healthy diet can help you maintain a healthy weight.  Changes may also include quitting smoking. Medicines  Medicines are usually given if diet and lifestyle changes have failed to reduce your cholesterol to healthy levels.  Your health care provider may prescribe a statin medicine. Statin medicines have been shown to reduce cholesterol, which can reduce the risk of heart disease. Follow these instructions at home: Eating and drinking If told by your health care provider:  Eat chicken (without skin), fish, veal, shellfish, ground [place] breast, and round or loin cuts of red meat.  Do not eat fried foods or fatty meats, such as hot dogs and salami.  Eat plenty of fruits, such as apples.  Eat plenty of vegetables, such as broccoli, potatoes, and carrots.  Eat beans, peas, and lentils.  Eat grains such as barley, rice, couscous, and bulgur wheat.  Eat pasta without cream  sauces.  Use skim or nonfat milk, and eat low-fat or nonfat yogurt and cheeses.  Do not eat or drink whole milk, cream, ice cream, egg yolks, or hard cheeses.  Do not eat stick margarine or tub margarines that contain trans fats (also called partially hydrogenated oils).  Do not eat saturated tropical oils, such as coconut oil and palm oil.  Do not eat cakes, cookies, crackers, or other baked goods that contain trans fats.  General instructions  Exercise as directed by your health care provider. Increase your activity level with activities such as gardening, walking, and taking the stairs.  Take over-the-counter and prescription medicines only as told by your health care provider.  Do not use any products that contain nicotine or tobacco, such as cigarettes and e-cigarettes. If you need help quitting, ask your health care provider.  Keep all follow-up visits as told by your health care provider. This is important. Contact a health care provider if:  You are struggling to maintain a healthy diet or weight.  You need help to start on an exercise program.  You need help to stop smoking. Get help right away if:  You have chest pain.  You have trouble breathing. This information is not intended to replace advice given to you by your health care provider. Make sure you discuss any questions you have with your health care provider. Document Released: 03/25/2005 Document Revised: 03/28/2017 Document Reviewed: 09/23/2015 Elsevier Patient Education  2020 [employer] Inc. Preventing Health Risks of Being Overweight Maintaining a healthy body weight is an important part of your overall health. Your healthy body weight depends on your age, gender, and height. Being overweight puts you at risk for many health problems, including:  Heart disease.  Diabetes.  Problems sleeping.  Joint problems. You can make changes to your diet and lifestyle to prevent these risks. Consider working with a  health care provider or a dietitian to make these changes. What nutrition changes can be made?   Eat only as much as your body needs. In most cases, this is about 2,000 calories a day, but the amount varies depending on your height, gender, and activity level. Ask your health care provider how many calories you should have each day. Eating more than your body needs on a regular basis can cause you to become overweight or obese.  Eat slowly, and stop eating when you feel full.  Choose healthy foods, including: ? Fruits and vegetables. ? Lean meats. ? Low-fat dairy products. ? High-fiber foods, such as whole grains and beans. ? Healthy snacks like vegetable sticks, a piece of fruit, or a small amount of yogurt or cheese.  Avoid foods and drinks that are high in sugar, salt (sodium), saturated fat, or trans fat. This includes: ? Many desserts such as candy, cookies, and ice cream. ? Soda. ? Fried foods. ? Processed meats such as hot dogs or lunch meats. ? Prepackaged snack foods. What lifestyle changes can be made?   Exercise for at least 150 minutes a week to prevent weight gain, or as often as recommended by your health care provider. Do moderate-intensity exercise, such as brisk walking. ? Spread it out by exercising for 30 minutes 5 days a week, or in short 10-minute bursts several times a day.  Find other ways to stay active and burn calories, such as yard work or a hobby that involves physical activity.  Get at least 8 hours of sleep each night. When you are well-rested, you are more likely to be active and make healthy choices during the day. To sleep better: ? Try to go to bed and wake up at about the same time every day. ? Keep your bedroom dark, quiet, and cool. ? Make sure that your bed is comfortable. ? Avoid stimulating activities, such as watching television or exercising, for at least one hour before bedtime. Why are these changes important? Eating healthy and being  active helps you lose weight and prevent health problems caused by being overweight. Making these changes can also help you manage stress, feel better mentally, and connect with friends and family. What can happen if changes are not made? Being overweight can affect you for your  entire life. You may develop joint or bone problems that make it painful or difficult for you to play sports or do activities you enjoy. Being overweight puts stress on your heart and lungs and can lead to medical problems like diabetes, heart disease, and sleeping problems. Where to find support You can get support for preventing health risks of being overweight from:  Your health care provider or a dietitian. They can provide guidance about healthy eating and healthy lifestyle choices.  Weight loss support groups, online or in-person. Where to find more information  MyPlate: https://ball-collins.biz/www.choosemyplate.gov ? This an online tool that provides personalized recommendations about foods to eat each day.  The Centers for Disease Control and Prevention: AffordableScrapbook.glwww.cdc.gov/healthyweight ? This resource gives tips for managing weight and having an active lifestyle. Summary  To prevent unhealthy weight gain, it is important to maintain a healthy diet high in vegetables and whole grains, exercise regularly, and get at least 8 hours of sleep each night.  Making these changes helps prevent many long-term (chronic) health conditions that can shorten your life, such as diabetes, heart disease, and stroke. This information is not intended to replace advice given to you by your health care provider. Make sure you discuss any questions you have with your health care provider. Document Released: 02/19/2017 Document Revised: 03/28/2017 Document Reviewed: 02/19/2017 Elsevier Patient Education  2020 [employer] Managing Your Hypertension Hypertension is commonly called high blood pressure. This is when the force of your blood pressing against the  walls of your arteries is too strong. Arteries are blood vessels that carry blood from your heart throughout your body. Hypertension forces the heart to work harder to pump blood, and may cause the arteries to become narrow or stiff. Having untreated or uncontrolled hypertension can cause heart attack, stroke, kidney disease, and other problems. What are blood pressure readings? A blood pressure reading consists of a higher number over a lower number. Ideally, your blood pressure should be below 120/80. The first ("top") number is called the systolic pressure. It is a measure of the pressure in your arteries as your heart beats. The second ("bottom") number is called the diastolic pressure. It is a measure of the pressure in your arteries as the heart relaxes. What does my blood pressure reading mean? Blood pressure is classified into four stages. Based on your blood pressure reading, your health care provider may use the following stages to determine what type of treatment you need, if any. Systolic pressure and diastolic pressure are measured in a unit called mm Hg. Normal  Systolic pressure: below 120.  Diastolic pressure: below 80. Elevated  Systolic pressure: 120-129.  Diastolic pressure: below 80. Hypertension stage 1  Systolic pressure: 130-139.  Diastolic pressure: 80-89. Hypertension stage 2  Systolic pressure: 140 or above.  Diastolic pressure: 90 or above. What health risks are associated with hypertension? Managing your hypertension is an important responsibility. Uncontrolled hypertension can lead to:  A heart attack.  A stroke.  A weakened blood vessel (aneurysm).  Heart failure.  Kidney damage.  Eye damage.  Metabolic syndrome.  Memory and concentration problems. What changes can I make to manage my hypertension? Hypertension can be managed by making lifestyle changes and possibly by taking medicines. Your health care provider will help you make a plan to  bring your blood pressure within a normal range. Eating and drinking   Eat a diet that is high in fiber and potassium, and low in salt (sodium), added sugar, and fat. An  example eating plan is called the DASH (Dietary Approaches to Stop Hypertension) diet. To eat this way: ? Eat plenty of fresh fruits and vegetables. Try to fill half of your plate at each meal with fruits and vegetables. ? Eat whole grains, such as whole wheat pasta, brown rice, or whole grain bread. Fill about one quarter of your plate with whole grains. ? Eat low-fat diary products. ? Avoid fatty cuts of meat, processed or cured meats, and poultry with skin. Fill about one quarter of your plate with lean proteins such as fish, chicken without skin, beans, eggs, and tofu. ? Avoid premade and processed foods. These tend to be higher in sodium, added sugar, and fat.  Reduce your daily sodium intake. Most people with hypertension should eat less than 1,500 mg of sodium a day.  Limit alcohol intake to no more than 1 drink a day for nonpregnant women and 2 drinks a day for men. One drink equals 12 oz of beer, 5 oz of wine, or 1 oz of hard liquor. Lifestyle  Work with your health care provider to maintain a healthy body weight, or to lose weight. Ask what an ideal weight is for you.  Get at least 30 minutes of exercise that causes your heart to beat faster (aerobic exercise) most days of the week. Activities may include walking, swimming, or biking.  Include exercise to strengthen your muscles (resistance exercise), such as weight lifting, as part of your weekly exercise routine. Try to do these types of exercises for 30 minutes at least 3 days a week.  Do not use any products that contain nicotine or tobacco, such as cigarettes and e-cigarettes. If you need help quitting, ask your health care provider.  Control any long-term (chronic) conditions you have, such as high cholesterol or diabetes. Monitoring  Monitor your blood  pressure at home as told by your health care provider. Your personal target blood pressure may vary depending on your medical conditions, your age, and other factors.  Have your blood pressure checked regularly, as often as told by your health care provider. Working with your health care provider  Review all the medicines you take with your health care provider because there may be side effects or interactions.  Talk with your health care provider about your diet, exercise habits, and other lifestyle factors that may be contributing to hypertension.  Visit your health care provider regularly. Your health care provider can help you create and adjust your plan for managing hypertension. Will I need medicine to control my blood pressure? Your health care provider may prescribe medicine if lifestyle changes are not enough to get your blood pressure under control, and if:  Your systolic blood pressure is 130 or higher.  Your diastolic blood pressure is 80 or higher. Take medicines only as told by your health care provider. Follow the directions carefully. Blood pressure medicines must be taken as prescribed. The medicine does not work as well when you skip doses. Skipping doses also puts you at risk for problems. Contact a health care provider if:  You think you are having a reaction to medicines you have taken.  You have repeated (recurrent) headaches.  You feel dizzy.  You have swelling in your ankles.  You have trouble with your vision. Get help right away if:  You develop a severe headache or confusion.  You have unusual weakness or numbness, or you feel faint.  You have severe pain in your chest or abdomen.  You vomit  repeatedly.  You have trouble breathing. Summary  Hypertension is when the force of blood pumping through your arteries is too strong. If this condition is not controlled, it may put you at risk for serious complications.  Your personal target blood pressure  may vary depending on your medical conditions, your age, and other factors. For most people, a normal blood pressure is less than 120/80.  Hypertension is managed by lifestyle changes, medicines, or both. Lifestyle changes include weight loss, eating a healthy, low-sodium diet, exercising more, and limiting alcohol. This information is not intended to replace advice given to you by your health care provider. Make sure you discuss any questions you have with your health care provider. Document Released: 12/18/2011 Document Revised: 07/17/2018 Document Reviewed: 02/21/2016 Elsevier Patient Education  2020 [employer].

## 2019-03-03 NOTE — Progress Notes (Signed)
States Dr Roxan Hockey took him off Hyzaar.  AMD

## 2019-03-05 ENCOUNTER — Encounter: Payer: Self-pay | Admitting: Registered Nurse

## 2019-03-05 NOTE — Progress Notes (Signed)
Subjective:    Patient ID: Brady Gilbert, male    DOB: 20-Dec-1960, 58 y.o.   MRN: [id number]  4oy/o caucasian married established male patient here for DOT physical/annual.  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  Last DOT by me 02/16/2018 was encouraged to lose weight due to hypertension/prediabetes/elevated cholesterol/chest pain 2019 noted hypokalemia on hydrochlorothiazide ER visit.  Patient goal was to lose 50 lbs.  Last office visit with Dr Dorris Fetch 06/29/2018 BP 152/90 see paper chart COB.  Patient had stopped eating white foods e.g. ranch dressing, potatoes, bread, rice and soda and increased his consumption of fruits and vegetables drinking coke zero once a day and water.  He golfs for exercise uses cart if course doesn't allow walking course.  Built 12x24 woodworking shed out of his carport and staying busy with projects and mowing his 3 acres.  During build of workshop had dizzy spells discussed with Dr Dorris Fetch and due to weight loss 30lbs hydrochlorothiazide discontinued and only taking 100mg  losartan now  Takes care of his 73 month old granddaughter every third weekend of the month.  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  Has noticed decreased knee pain espeically with up and down stairs much easier and back not hurting any more.  Rare for him to take tylenol extra strength po prn now.  GI called him to schedule colonoscopy.  Noted in paper chart due every 5 years Dr 8 retired.  Last 11/2015 per chart review due 2022 patient notified and is going to follow up with his GI office for clarification.  Still doing well post bilateral blepharoplasty/ptosis repair and off eye drops now; denied any visual impairments.  Positive Covid 19 test PCR 01/07/2019 patient denied any continuing symptoms/concerns.  Son was  positive/nurse where he believes exposure occurred.  Last fasting labs 02/18/2019 would like to discuss results today.  See FMCSA 5875     Review of Systems  Constitutional: Positive for activity change. Negative for appetite change, chills, diaphoresis, fatigue and fever.  HENT: Negative for hearing loss, trouble swallowing and voice change.   Eyes: Negative for photophobia, pain, discharge, redness, itching and visual disturbance.  Respiratory: Negative for cough, choking, shortness of breath, wheezing and stridor.   Cardiovascular: Negative for chest pain, palpitations and leg swelling.  Gastrointestinal: Negative for abdominal pain, anal bleeding, blood in stool, constipation, diarrhea, nausea, rectal pain and vomiting.  Endocrine: Negative for cold intolerance and heat intolerance.  Genitourinary: Negative for decreased urine volume and difficulty urinating.  Musculoskeletal: Negative for arthralgias, back pain, gait problem, joint swelling, myalgias, neck pain and neck stiffness.  Skin: Negative for color change, pallor, rash and wound.  Allergic/Immunologic: Negative for environmental allergies and food allergies.  Neurological: Negative for dizziness, tremors, seizures, syncope, facial asymmetry, speech difficulty, weakness, light-headedness, numbness and headaches.  Hematological: Negative for adenopathy. Does not bruise/bleed easily.  Psychiatric/Behavioral: Negative for agitation, confusion and sleep disturbance.       Objective:   Physical Exam Vitals signs and nursing note reviewed. Exam conducted with a chaperone present (RN 13/03/2019 chaperoned inguinal hernia exam only).  Constitutional:      General: He is awake. He is not in acute distress.    Appearance: Normal appearance. He is well-developed and well-groomed. He is obese. He is not ill-appearing, toxic-appearing or diaphoretic.  HENT:  Head: Normocephalic and atraumatic.     Jaw: There is normal jaw occlusion.  No trismus, tenderness, swelling, pain on movement or malocclusion.     Salivary Glands: Right salivary gland is not diffusely enlarged or tender. Left salivary gland is not diffusely enlarged or tender.     Right Ear: Hearing, tympanic membrane, ear canal and external ear normal. No middle ear effusion. There is no impacted cerumen.     Left Ear: Hearing, tympanic membrane, ear canal and external ear normal.  No middle ear effusion. There is no impacted cerumen.     Nose: Nose normal. No nasal deformity, septal deviation, signs of injury, laceration, nasal tenderness, mucosal edema, congestion or rhinorrhea.     Right Turbinates: Not enlarged, swollen or pale.     Left Turbinates: Not enlarged, swollen or pale.     Right Sinus: No maxillary sinus tenderness or frontal sinus tenderness.     Left Sinus: No maxillary sinus tenderness or frontal sinus tenderness.     Mouth/Throat:     Lips: Pink. No lesions.     Mouth: Mucous membranes are moist. Mucous membranes are not pale, not dry and not cyanotic. No injury, lacerations, oral lesions or angioedema.     Dentition: Normal dentition. Does not have dentures. No dental tenderness, gingival swelling, dental caries, dental abscesses or gum lesions.     Tongue: No lesions. Tongue does not deviate from midline.     Palate: No mass and lesions.     Pharynx: Oropharynx is clear. Uvula midline. No pharyngeal swelling, oropharyngeal exudate, posterior oropharyngeal erythema or uvula swelling.     Tonsils: No tonsillar exudate or tonsillar abscesses. 0 on the right. 0 on the left.  Eyes:     General: Lids are normal. Vision grossly intact. Gaze aligned appropriately. No allergic shiner, visual field deficit or scleral icterus.       Right eye: No foreign body, discharge or hordeolum.        Left eye: No foreign body, discharge or hordeolum.     Extraocular Movements: Extraocular movements intact.     Right eye: Normal extraocular motion and no nystagmus.       Left eye: Normal extraocular motion and no nystagmus.     Conjunctiva/sclera: Conjunctivae normal.     Right eye: Right conjunctiva is not injected. No chemosis, exudate or hemorrhage.    Left eye: Left conjunctiva is not injected. No chemosis, exudate or hemorrhage.    Pupils: Pupils are equal, round, and reactive to light. Pupils are equal.     Right eye: Pupil is round and reactive.     Left eye: Pupil is round and reactive.  Neck:     Musculoskeletal: Normal range of motion and neck supple. Normal range of motion. No edema, erythema, neck rigidity, crepitus, injury, pain with movement, torticollis, spinous process tenderness or muscular tenderness.     Thyroid: No thyroid mass, thyromegaly or thyroid tenderness.     Vascular: No carotid bruit.     Trachea: Trachea and phonation normal. No tracheal tenderness or tracheal deviation.  Cardiovascular:     Rate and Rhythm: Normal rate and regular rhythm.     Chest Wall: PMI is not displaced.     Pulses: Normal pulses.          Radial pulses are 2+ on the right side and 2+ on the left side.     Heart sounds: Normal heart sounds, S1 normal and S2 normal. Heart sounds not distant.  No murmur. No friction rub. No gallop.   Pulmonary:     Effort: Pulmonary effort is normal. No respiratory distress.     Breath sounds: Normal breath sounds and air entry. No stridor, decreased air movement or transmitted upper airway sounds. No decreased breath sounds, wheezing, rhonchi or rales.     Comments: Wearing cloth mask due to covid 19 pandemic; no cough observed in exam room; spoke full sentences without difficulty Abdominal:     General: Abdomen is flat. Bowel sounds are decreased. There is no distension or abdominal bruit. There are no signs of injury.     Palpations: Abdomen is soft. There is no shifting dullness, fluid wave, hepatomegaly, splenomegaly, mass or pulsatile mass.     Tenderness: There is no abdominal tenderness. There is no right CVA  tenderness, left CVA tenderness, guarding or rebound. Negative signs include Murphy's sign and McBurney's sign.     Hernia: A hernia is present. Hernia is present in the ventral area. There is no hernia in the umbilical area, left inguinal area or right inguinal area.       Comments: With abdominal crunch noted ventral hernia 4x7cm resolves at rest supine/sitting/standing/nontender to palpation "patient reported slightly smaller than last year with weight loss"  Genitourinary:    Penis: Normal and circumcised.      Scrotum/Testes: Cremasteric reflex is present.     Tanner stage (genital): 5.     Comments: Patient refused offer of gown/drape Musculoskeletal: Normal range of motion.        General: No swelling, tenderness, deformity or signs of injury.     Right shoulder: Normal.     Left shoulder: Normal.     Right elbow: Normal.    Left elbow: Normal.     Right wrist: Normal.     Left wrist: Normal.     Right hip: Normal.     Left hip: Normal.     Right knee: He exhibits normal range of motion, no swelling, no effusion, no ecchymosis, no deformity, no laceration, no erythema, normal alignment, no LCL laxity, normal patellar mobility and no bony tenderness. No tenderness found.     Left knee: He exhibits normal range of motion, no swelling, no effusion, no ecchymosis, no deformity, no laceration, no erythema, normal alignment, no LCL laxity, normal patellar mobility, no bony tenderness, normal meniscus and no MCL laxity. No tenderness found.     Right ankle: Normal.     Left ankle: Normal.     Cervical back: Normal.     Thoracic back: Normal.     Lumbar back: Normal.     Right hand: Normal.     Left hand: Normal.     Right lower leg: No edema.     Left lower leg: No edema.     Comments: Able to touch toes; crepitus knees bilaterally with weight bearing flexion/squat; negative atchley scratch/neers/internal/external shoulder rotation; full arom  shoulders/knees/fingers/wrist/elbow/hip/ankles bilaterally equal  Lymphadenopathy:     Head:     Right side of head: No submental, submandibular, tonsillar, preauricular, posterior auricular or occipital adenopathy.     Left side of head: No submental, submandibular, tonsillar, preauricular, posterior auricular or occipital adenopathy.     Cervical: No cervical adenopathy.     Right cervical: No superficial, deep or posterior cervical adenopathy.    Left cervical: No superficial, deep or posterior cervical adenopathy.     Lower Body: No right inguinal adenopathy. No left inguinal adenopathy.  Skin:  General: Skin is warm and dry.     Capillary Refill: Capillary refill takes less than 2 seconds.     Coloration: Skin is not ashen, cyanotic, jaundiced, mottled, pale or sallow.     Findings: No abrasion, abscess, acne, bruising, burn, ecchymosis, erythema, signs of injury, laceration, lesion, petechiae, rash or wound.     Nails: There is no clubbing.      Comments: Tattoos as noted on FMCSA 5875  Neurological:     General: No focal deficit present.     Mental Status: He is alert and oriented to person, place, and time. Mental status is at baseline.     GCS: GCS eye subscore is 4. GCS verbal subscore is 5. GCS motor subscore is 6.     Cranial Nerves: Cranial nerves are intact. No cranial nerve deficit, dysarthria or facial asymmetry.     Sensory: Sensation is intact. No sensory deficit.     Motor: Motor function is intact. No weakness, tremor, atrophy, abnormal muscle tone or seizure activity.     Coordination: Coordination is intact. Coordination normal.     Gait: Gait is intact. Gait normal.     Deep Tendon Reflexes: Reflexes normal.     Reflex Scores:      Patellar reflexes are 2+ on the right side and 2+ on the left side.    Comments: Gait sure and steady in clinic; on/off exam table and in/out of chair without difficulty; standing to sitting to supine and reverse quickly; bilateral  hand grasp and upper/lower extremity strength equal 5/5  Psychiatric:        Attention and Perception: Attention and perception normal.        Mood and Affect: Mood and affect normal.        Speech: Speech normal.        Behavior: Behavior normal. Behavior is cooperative.        Thought Content: Thought content normal.        Cognition and Memory: Cognition and memory normal.        Judgment: Judgment normal.       Patient given printout of lab results/note  GFR back to normal and electrolytes/renal function normal again compared to 2019.  GFR was decreased to 50 in 2019.  Glucose spot almost normal 100 and HgbA1c 5.7.  LDL elevated 134.  Last EKG NSR 19 Oct 2017.  Patient verbalized understanding information/instructions, agreed with plan of care and had no further questions at this time.    Assessment & Plan:  A-DOT medical certificate, BMI 35, essential hypertension, ventral hernia, HDL goal less than 100, prediabetes  P-Patient information entered into national DOT/F[employer] medical certificate database and copy of certificate printed and given to patient; see paper copy FMCSA 5875 and 5876 in outpatient paper record at [facility].  Due to hypertension only 1 year certificate given.   Patient to follow up in 1 year.  Sooner if new medical problems/hospitalization/ER visit.  Patient instructed to send in copy FMCSA 5876 per [place] DOT directives.  Patient verbalized understanding information/instructions, agreed with plan of care and had no further questions at this time.  BMI 35 improved from last visit with me in 2019.  Continue with decreased non-nutritional carbohydrates/products with added sugar and keeping up exercise/activity level.  Goal 150 minutes exercise per week and 1/2 lb weight loss per week.  Consider increasing steps by 1000 per week check your health tracker on phone to see your average over the  past couple months/goal 10,000 steps or more per day. [employer]care handout  printed and given on overweight preventing health risks. Patient verbalized understanding information/instructions, agreed with plan of care and had no further questions at this time.  Blood pressure improved even with discontinuation of hydrochlorothiazide due to weight loss.  If getting dizzy/postural hypotension notify clinic staff as with continued weight loss losartan may need dose adjustment.  Avoid dehydration.  Continue to keep active.  Avoid weight gain during holidays.  Follow up 1 year for annual labs.  Schedule routine optometry appt when due Jan 2021 for annual. [facility] handout printed and given on managing blood pressure. Patient verbalized understanding information/instructions, agreed with plan of care and had no further questions at this time.  Discussed to continue monitoring ventral hernia if enlarging/painful/bulge not resolving at rest seek same day re-evaluation with provider especially if vomiting/blood red or black in emesis/stool.  Avoid weight gain.  Avoid holding breath with heavy lifting.  Patient noted father had same and did not require surgery.    Exitcare handout printed and given on ventral hernia to patient.  Patient verbalized understanding information/instructions, agreed with plan of care and had no further questions at this time.    LDL 134.  Obesity/hypertension.  I recommend increasing fiber to 30 grams per day in your diet; keeping hydrated with no sugar added/noncaffeinated beverages, eating whole grains/fruits/vegetables every day and keeping added sugars to less than 150 calories/9 teaspoons per day. I recommend exercising 150 minutes per week and weight loss until 50 lbs loss goal reached. Repeat lipid panel in 12 months.  Exitcare handout printed and given on high cholesterol to patient.  Patient verbalized understanding information/instructions, agreed with plan of care and had no further questions at this time.  HgbA1c elevated at 5.7. I recommend increasing  fiber to 30 grams per day in your diet; keeping hydrated with no sugar added/noncaffeinated beverages, eating whole grains/fruits/vegetables every day and keeping added sugars to less than 150 calories/9 teaspoons per day. I recommend exercising 150 minutes per week and weight loss until 50 lbs loss goal reached.  Exitcare handout on prediabetes printed and given to patient.   Repeat HgbA1c in 6 to 12 months.  Patient verbalized understanding information/instructions, agreed with plan of care and had no further questions at this time.

## 2019-06-11 ENCOUNTER — Other Ambulatory Visit: Payer: Self-pay

## 2019-06-11 ENCOUNTER — Ambulatory Visit (INDEPENDENT_AMBULATORY_CARE_PROVIDER_SITE_OTHER): Payer: 59 | Admitting: Surgery

## 2019-06-11 ENCOUNTER — Encounter: Payer: Self-pay | Admitting: Surgery

## 2019-06-11 VITALS — BP 159/90 | HR 101 | Temp 97.7°F | Resp 12 | Ht 74.0 in | Wt 270.0 lb

## 2019-06-11 DIAGNOSIS — R1030 Lower abdominal pain, unspecified: Secondary | ICD-10-CM | POA: Diagnosis not present

## 2019-06-11 NOTE — Progress Notes (Signed)
06/11/2019  Reason for Visit:  Abdominal pain  History of Present Illness: Brady Gilbert is a 59 y.o. male presenting for evaluation of lower abdominal pain.  The patient has a history of sigmoid diverticulitis with numerous foci of free air in 07/2014, which was successfully treated conservatively.  He was seen on follow up and the patient did well and did not need further follow up shortly after.  He has been doing well until recently that he developed some mild lower abdominal discomfort.  He reports he had a day or two of diarrhea and afterwards had "twinging' sensations in the lower abdomen.  He also reports having some difficulty with his urination, but now all the symptoms have resolved.  Denies having any fevers, chills, chest pain, shortness of breath.  Denies any nausea or vomiting.  Other than that day or two of diarrhea, has had normal bowel movements.  Denies any new bulging in his abdomen in any area.  Denies any blood in the stool.  He was concerned that he could be having symptoms of diverticulitis again and wanted to be seen as a precaution.  Past Medical History: Past Medical History:  Diagnosis Date  . Diverticulitis   . Elevated lipids   . Gout   . Hypertension   . Overweight      Past Surgical History: Past Surgical History:  Procedure Laterality Date  . APPENDECTOMY  1961  . BELPHAROPTOSIS REPAIR  12/2017  . lasix      Home Medications: Prior to Admission medications   Medication Sig Start Date End Date Taking? Authorizing Provider  Ascorbic Acid (VITAMIN C PO) Take by mouth daily.   Yes [provider]  Cholecalciferol (VITAMIN D3 PO) Take by mouth daily.   Yes [provider]  Cyanocobalamin (VITAMIN B-12 PO) Take by mouth daily.   Yes [provider]  losartan (COZAAR) 100 MG tablet TK 1 T PO D 12/24/18  Yes [provider]  Multiple Vitamins-Minerals (ZINC PO) Take by mouth daily.   Yes [provider]     Allergies: No Known Allergies  Social History:  reports that he has quit smoking. His smokeless tobacco use includes chew. He reports previous alcohol use. He reports that he does not use drugs.   Family History: Family History  Problem Relation Age of Onset  . Heart disease Father     Review of Systems: Review of Systems  Constitutional: Negative for chills and fever.  HENT: Negative for hearing loss.   Respiratory: Negative for shortness of breath.   Cardiovascular: Negative for chest pain.  Gastrointestinal: Positive for abdominal pain and diarrhea. Negative for constipation, nausea and vomiting.  Genitourinary: Positive for frequency. Negative for dysuria.  Musculoskeletal: Negative for myalgias.  Skin: Negative for rash.  Neurological: Negative for dizziness.  Psychiatric/Behavioral: Negative for depression.    Physical Exam BP (!) 159/90   Pulse (!) 101   Temp 97.7 F (36.5 C) (Temporal)   Resp 12   Ht 6\' 2"  (1.88 m)   Wt 270 lb (122.5 kg)   SpO2 97%   BMI 34.67 kg/m  CONSTITUTIONAL: No acute distress HEENT:  Normocephalic, atraumatic, extraocular motion intact. NECK: Trachea is midline, and there is no jugular venous distension.  RESPIRATORY:  Lungs are clear, and breath sounds are equal bilaterally. Normal respiratory effort without pathologic use of accessory muscles. CARDIOVASCULAR: Heart is regular without murmurs, gallops, or rubs. GI: The abdomen is soft, non-distended, currently non-tender to palpation.  Patient  has a well healed RLQ incision from open appendectomy.  Patient has diastasis recti of the upper abdomen, without any evidence of a hernia.  MUSCULOSKELETAL:  Normal muscle strength and tone in all four extremities.  No peripheral edema or cyanosis. SKIN: Skin turgor is normal. There are no pathologic skin lesions.  NEUROLOGIC:  Motor and sensation is grossly normal.  Cranial nerves are grossly intact. PSYCH:  Alert and oriented to person,  place and time. Affect is normal.  Laboratory Analysis: No results found for this or any previous visit (from the past 24 hour(s)).  Imaging: No results found.  Assessment and Plan: This is a 59 y.o. male with recent history of lower abdominal discomfort and diarrhea.  --Discussed with the patient that I do not think he's having a bout of diverticulitis.  His pain has resolved on its own, and had no other systemic symptoms.  He could potentially have had an episode of gastroenteritis.  Some of the discomfort could be related to scar tissue from his prior episode of diverticulitis.  His urinary issues of frequency during daytime could be related to prostate issues.  He denies any dysuria and it's unlikely he had a UTI.   --For now, there are no acute surgical needs.  Reassured the patient that at this point, I do not think there is anything suspicious that would warrant further imaging, hospital admission, or antibiotics. --Follow up as needed.  Face-to-face time spent with the patient and care providers was 30 minutes, with more than 50% of the time spent counseling, educating, and coordinating care of the patient.     Melvyn Neth, [facility]

## 2019-06-11 NOTE — Patient Instructions (Addendum)
Call us if you start to have symptoms of diverticulitis.  Follow-up with our office as needed.

## 2019-07-02 ENCOUNTER — Other Ambulatory Visit: Payer: Self-pay

## 2019-07-02 DIAGNOSIS — I10 Essential (primary) hypertension: Secondary | ICD-10-CM

## 2019-07-02 MED ORDER — LOSARTAN POTASSIUM 100 MG PO TABS: 100.0000 mg | ORAL_TABLET | Freq: Every day | ORAL | 2 refills | Status: DC

## 2019-07-04 IMAGING — CR DG CHEST 2V
1 series · 2 of 2 positions shown · non-contrast
Comparison: None.

CLINICAL DATA: Tachycardia.  Hypertension.  Light smoker.

EXAM:
CHEST - 2 VIEW

[Series 1: dg chest 2 view · 0.14mm/px · 2 of 2 slices shown]
[im 1/2]
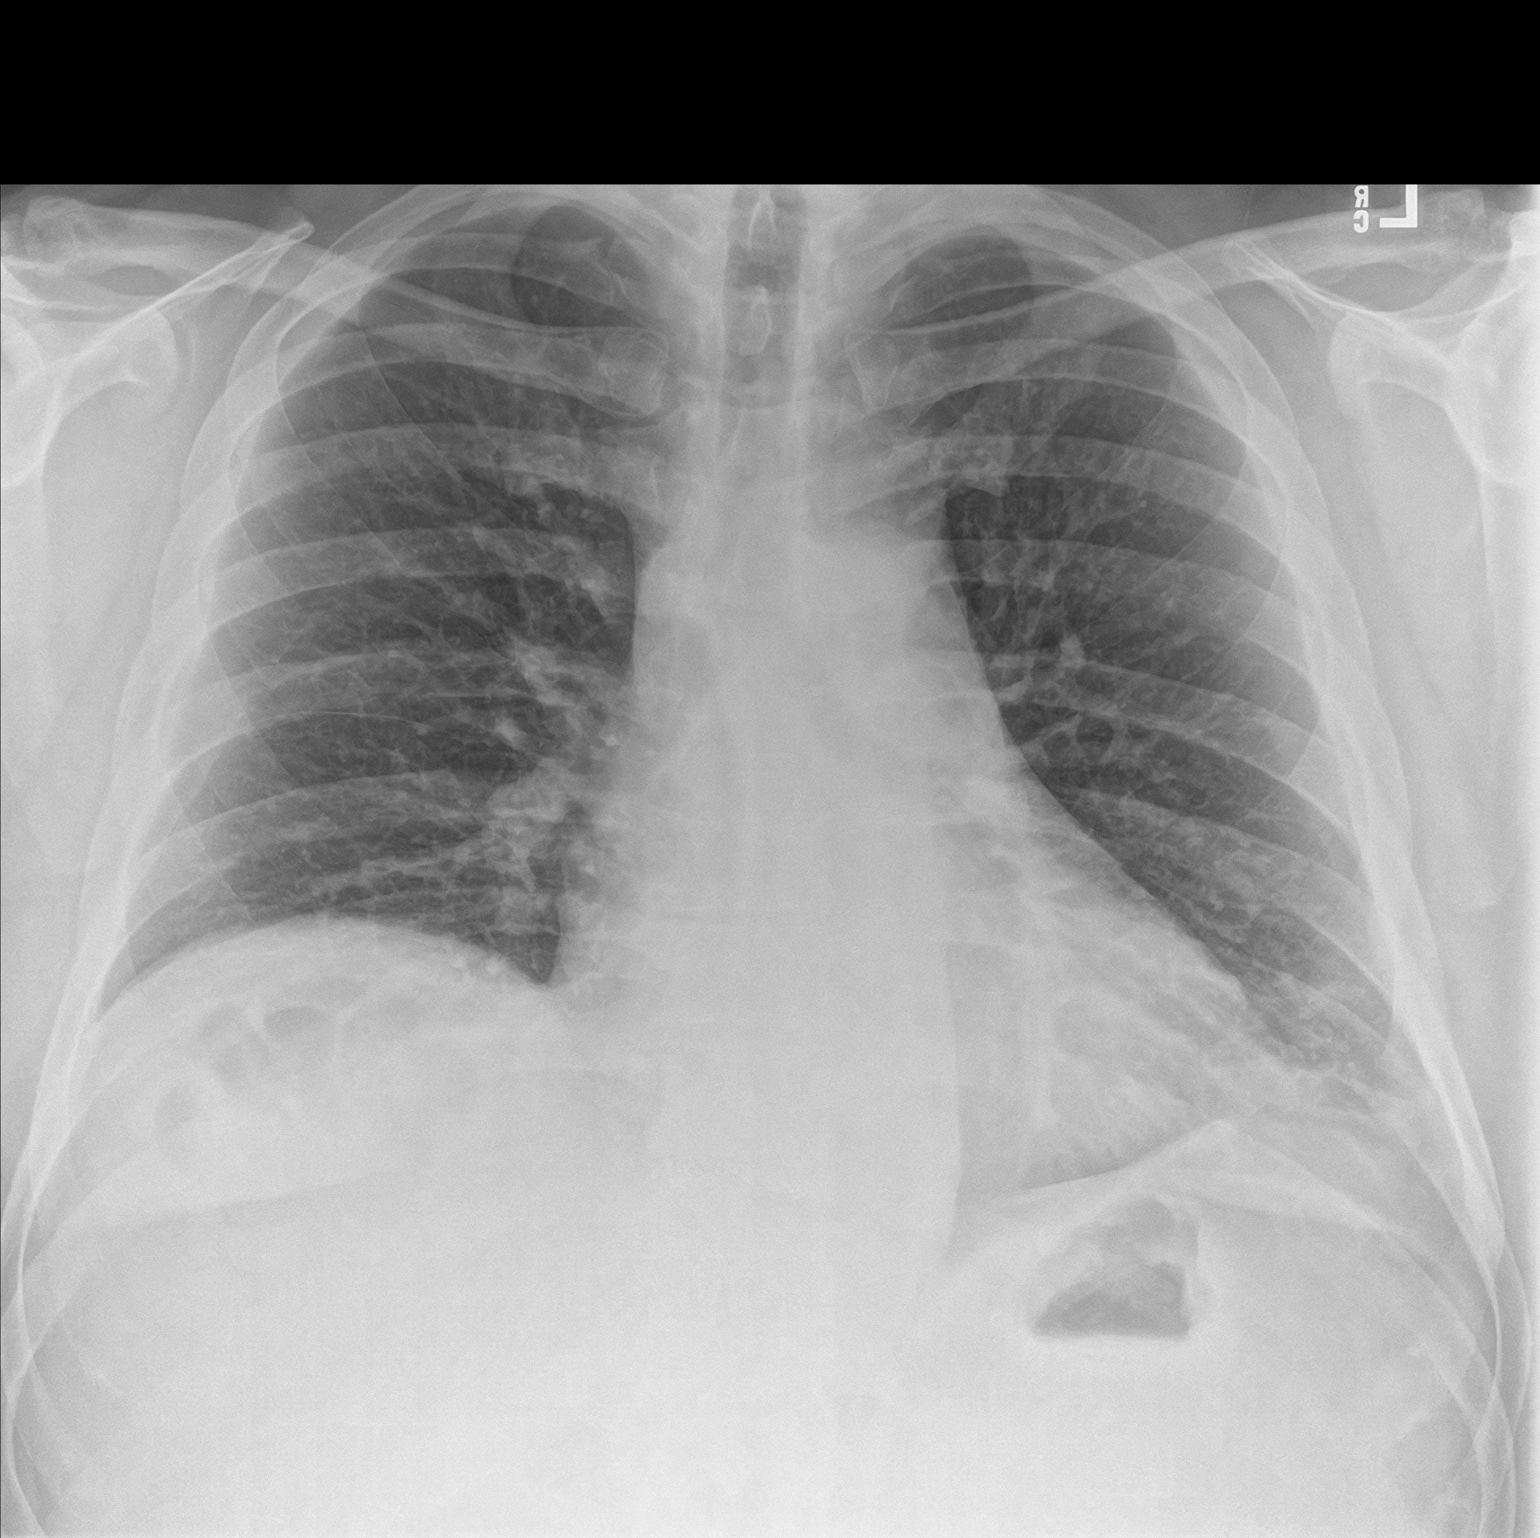
[im 2/2]
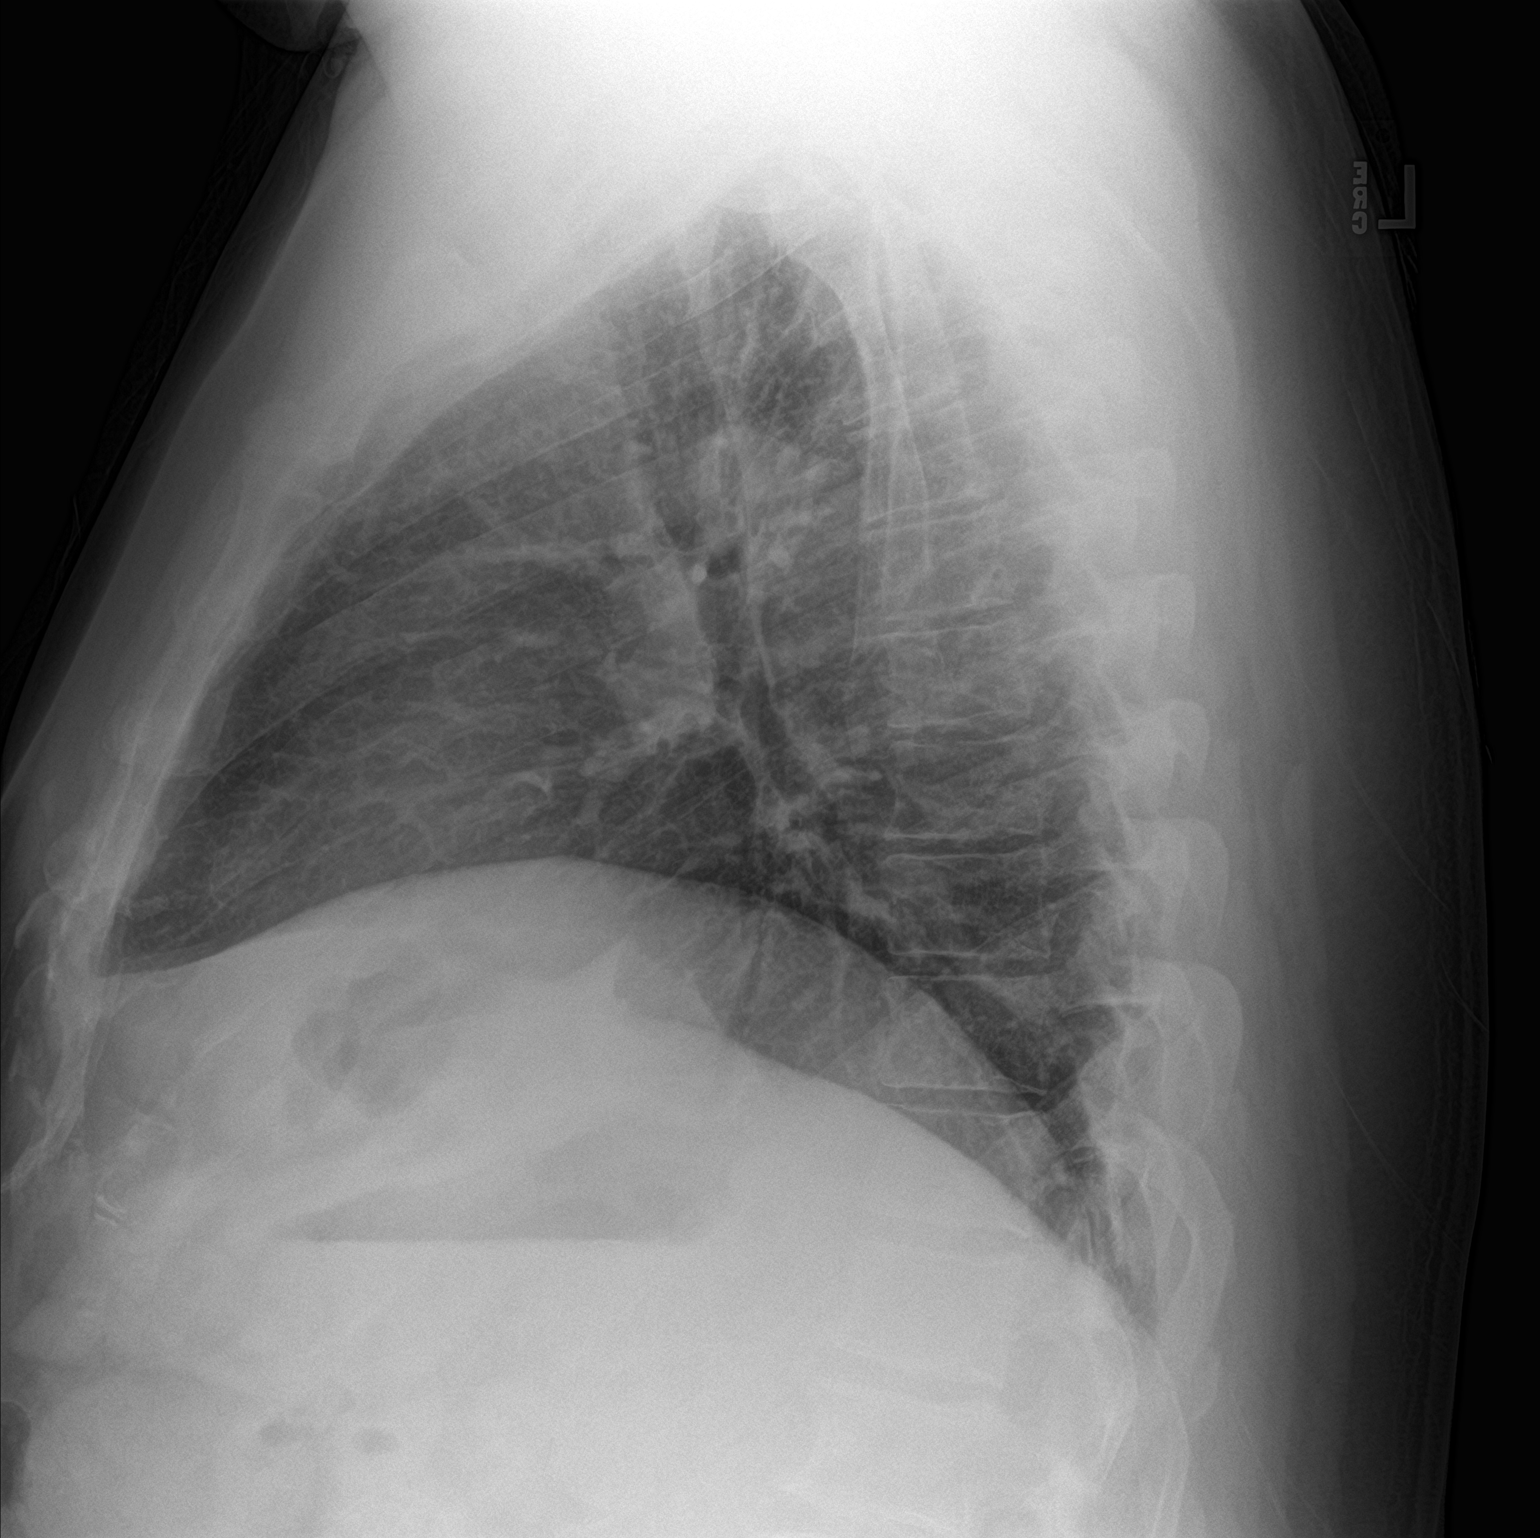

[2 of 2 positions shown; findings below may reference images not displayed]

FINDINGS: Midline trachea. Mild cardiomegaly. Mediastinal contours otherwise
within normal limits. No pleural effusion or pneumothorax. Lung
volumes are low. There is mild to moderate right hemidiaphragm
elevation. Density along the left heart border on the frontal
radiograph is not well localized on the lateral. Diffuse
peribronchial thickening.
IMPRESSION: Low lung volumes with increased density along the left heart border.
This is not localized on the lateral view. Possibly related to
lingular volume loss and atelectasis. If there are prior radiographs
for comparison, these would be useful. Especially if there are
symptoms to suggest left-sided pneumonia, consider radiographic
follow-up in 5-7 days.

Peribronchial thickening which may relate to chronic bronchitis or
smoking.

## 2019-07-05 DIAGNOSIS — R300 Dysuria: Secondary | ICD-10-CM | POA: Diagnosis not present

## 2019-07-05 DIAGNOSIS — R3989 Other symptoms and signs involving the genitourinary system: Secondary | ICD-10-CM | POA: Diagnosis not present

## 2019-07-05 DIAGNOSIS — R319 Hematuria, unspecified: Secondary | ICD-10-CM | POA: Diagnosis not present

## 2019-07-05 DIAGNOSIS — R109 Unspecified abdominal pain: Secondary | ICD-10-CM | POA: Diagnosis not present

## 2020-01-26 DIAGNOSIS — R000 Tachycardia, unspecified: Secondary | ICD-10-CM | POA: Diagnosis not present

## 2020-01-26 DIAGNOSIS — R400 Somnolence: Secondary | ICD-10-CM | POA: Diagnosis not present

## 2020-01-26 DIAGNOSIS — R002 Palpitations: Secondary | ICD-10-CM | POA: Diagnosis not present

## 2020-01-26 DIAGNOSIS — R079 Chest pain, unspecified: Secondary | ICD-10-CM | POA: Diagnosis not present

## 2020-02-14 DIAGNOSIS — R002 Palpitations: Secondary | ICD-10-CM | POA: Diagnosis not present

## 2020-02-25 ENCOUNTER — Ambulatory Visit: Payer: Self-pay

## 2020-02-25 ENCOUNTER — Other Ambulatory Visit: Payer: Self-pay

## 2020-02-25 DIAGNOSIS — Z0000 Encounter for general adult medical examination without abnormal findings: Secondary | ICD-10-CM

## 2020-02-25 LAB — POCT URINALYSIS DIPSTICK
Bilirubin, UA: NEGATIVE
Blood, UA: NEGATIVE
Glucose, UA: NEGATIVE
Ketones, UA: NEGATIVE
Leukocytes, UA: NEGATIVE
Nitrite, UA: NEGATIVE
Protein, UA: NEGATIVE
Spec Grav, UA: 1.025 (ref 1.010–1.025)
Urobilinogen, UA: 0.2 E.U./dL
pH, UA: 5.5 (ref 5.0–8.0)

## 2020-02-25 NOTE — Progress Notes (Signed)
Scheduled to complete physical 03/07/2020 with Durward Parcel, PA-C.  Recent EKG with Dr. [facility]) - can be seen with Care Everywhere.  AMD

## 2020-02-26 LAB — CMP12+LP+TP+TSH+6AC+PSA+CBC…
ALT: 16 IU/L (ref 0–44)
AST: 15 IU/L (ref 0–40)
Albumin/Globulin Ratio: 2 (ref 1.2–2.2)
Albumin: 4.3 g/dL (ref 3.8–4.8)
Alkaline Phosphatase: 71 IU/L (ref 44–121)
BUN/Creatinine Ratio: 13 (ref 10–24)
BUN: 14 mg/dL (ref 8–27)
Basophils Absolute: 0.1 10*3/uL (ref 0.0–0.2)
Basos: 1 %
Bilirubin Total: 0.4 mg/dL (ref 0.0–1.2)
Calcium: 9.5 mg/dL (ref 8.6–10.2)
Chloride: 104 mmol/L (ref 96–106)
Chol/HDL Ratio: 5.1 ratio — ABNORMAL HIGH (ref 0.0–5.0)
Cholesterol, Total: 209 mg/dL — ABNORMAL HIGH (ref 100–199)
Creatinine, Ser: 1.12 mg/dL (ref 0.76–1.27)
EOS (ABSOLUTE): 0.2 10*3/uL (ref 0.0–0.4)
Eos: 2 %
Estimated CHD Risk: 1.1 times avg. — ABNORMAL HIGH (ref 0.0–1.0)
Free Thyroxine Index: 1.4 (ref 1.2–4.9)
GFR calc Af Amer: 82 mL/min/{1.73_m2} (ref >59)
GFR calc non Af Amer: 71 mL/min/{1.73_m2} (ref >59)
GGT: 14 IU/L (ref 0–65)
Globulin, Total: 2.2 g/dL (ref 1.5–4.5)
Glucose: 107 mg/dL — ABNORMAL HIGH (ref 65–99)
HDL: 41 mg/dL (ref >39)
Hematocrit: 47.3 % (ref 37.5–51.0)
Hemoglobin: 16.3 g/dL (ref 13.0–17.7)
Immature Grans (Abs): 0 10*3/uL (ref 0.0–0.1)
Immature Granulocytes: 0 %
Iron: 73 ug/dL (ref 38–169)
LDH: 157 IU/L (ref 121–224)
LDL Chol Calc (NIH): 138 mg/dL — ABNORMAL HIGH (ref 0–99)
Lymphocytes Absolute: 2.5 10*3/uL (ref 0.7–3.1)
Lymphs: 35 %
MCH: 30.8 pg (ref 26.6–33.0)
MCHC: 34.5 g/dL (ref 31.5–35.7)
MCV: 89 fL (ref 79–97)
Monocytes Absolute: 0.5 10*3/uL (ref 0.1–0.9)
Monocytes: 7 %
Neutrophils Absolute: 3.8 10*3/uL (ref 1.4–7.0)
Neutrophils: 55 %
Phosphorus: 2.6 mg/dL — ABNORMAL LOW (ref 2.8–4.1)
Platelets: 235 10*3/uL (ref 150–450)
Potassium: 5.6 mmol/L — ABNORMAL HIGH (ref 3.5–5.2)
Prostate Specific Ag, Serum: 0.6 ng/mL (ref 0.0–4.0)
RBC: 5.29 x10E6/uL (ref 4.14–5.80)
RDW: 12.1 % (ref 11.6–15.4)
Sodium: 144 mmol/L (ref 134–144)
T3 Uptake Ratio: 23 % — ABNORMAL LOW (ref 24–39)
T4, Total: 5.9 ug/dL (ref 4.5–12.0)
TSH: 2.1 u[IU]/mL (ref 0.450–4.500)
Total Protein: 6.5 g/dL (ref 6.0–8.5)
Triglycerides: 168 mg/dL — ABNORMAL HIGH (ref 0–149)
Uric Acid: 6.4 mg/dL (ref 3.8–8.4)
VLDL Cholesterol Cal: 30 mg/dL (ref 5–40)
WBC: 7 10*3/uL (ref 3.4–10.8)

## 2020-02-28 DIAGNOSIS — R079 Chest pain, unspecified: Secondary | ICD-10-CM | POA: Diagnosis not present

## 2020-02-28 DIAGNOSIS — R002 Palpitations: Secondary | ICD-10-CM | POA: Diagnosis not present

## 2020-03-07 ENCOUNTER — Other Ambulatory Visit: Payer: Self-pay

## 2020-03-07 ENCOUNTER — Encounter: Payer: Self-pay | Admitting: Physician Assistant

## 2020-03-07 ENCOUNTER — Ambulatory Visit: Payer: Self-pay | Admitting: Physician Assistant

## 2020-03-07 VITALS — BP 131/80 | HR 93 | Temp 98.2°F | Resp 16 | Ht 74.0 in | Wt 270.0 lb

## 2020-03-07 DIAGNOSIS — Z0000 Encounter for general adult medical examination without abnormal findings: Secondary | ICD-10-CM

## 2020-03-07 NOTE — Progress Notes (Signed)
   Subjective: Annual physical exam    Patient ID: Brady Gilbert, male    DOB: [birth year], 59 y.o.   MRN: [id number]  HPI Patient presents annual physical exam voices no concerns or complaints.  Patient he was recently diagnosed with sleep apnea is looking forward to using CPAP machine.   Review of Systems    Hypertension and sleep apnea Objective:   Physical Exam No acute distress.  HEENT was unremarkable.  Neck is supple for adenopathy or bruits.  Lungs are clear to auscultation.  Heart regular rate and rhythm.  Abdomen is slightly distended secondary to body habitus.  Normoactive bowel sounds, soft, nontender to palpation.  No obvious deformity to the upper or lower extremities.  Patient has full and equal range of motion of the upper and lower extremities.  No obvious deformity to the cervical lumbar spine.  Patient has full and equal range of motion of the cervical lumbar spine.  Cranial nerves II through XII are grossly intact.       Assessment & Plan: Well exam  Discussed patient lab results patient cholesterol is 209, triglycerides 168.  HDL 41.  Patient elected to lifestyle modifications to the medications.  Patient advised to follow-up as needed.

## 2020-03-07 NOTE — Progress Notes (Signed)
Pt presents today to complete physical with Durward Parcel, PA-C. Pt denies any issues or concerns.   CL,RMA

## 2020-04-10 ENCOUNTER — Other Ambulatory Visit: Payer: Self-pay

## 2020-04-10 DIAGNOSIS — I10 Essential (primary) hypertension: Secondary | ICD-10-CM

## 2020-04-10 MED ORDER — LOSARTAN POTASSIUM 100 MG PO TABS: 100.0000 mg | ORAL_TABLET | Freq: Every day | ORAL | 1 refills | Status: DC

## 2020-07-14 DIAGNOSIS — G4733 Obstructive sleep apnea (adult) (pediatric): Secondary | ICD-10-CM | POA: Diagnosis not present

## 2020-08-13 DIAGNOSIS — G4733 Obstructive sleep apnea (adult) (pediatric): Secondary | ICD-10-CM | POA: Diagnosis not present

## 2020-08-30 DIAGNOSIS — R002 Palpitations: Secondary | ICD-10-CM | POA: Diagnosis not present

## 2020-08-30 DIAGNOSIS — R079 Chest pain, unspecified: Secondary | ICD-10-CM | POA: Diagnosis not present

## 2020-09-13 DIAGNOSIS — G4733 Obstructive sleep apnea (adult) (pediatric): Secondary | ICD-10-CM | POA: Diagnosis not present

## 2020-09-20 ENCOUNTER — Other Ambulatory Visit: Payer: Self-pay

## 2020-09-20 ENCOUNTER — Ambulatory Visit: Payer: Self-pay | Admitting: Adult Health

## 2020-09-20 ENCOUNTER — Encounter: Payer: Self-pay | Admitting: Adult Health

## 2020-09-20 VITALS — BP 124/90 | HR 95 | Temp 97.7°F | Resp 14 | Ht 74.0 in | Wt 275.0 lb

## 2020-09-20 DIAGNOSIS — M62830 Muscle spasm of back: Secondary | ICD-10-CM

## 2020-09-20 MED ORDER — IBUPROFEN 800 MG PO TABS: 800.0000 mg | ORAL_TABLET | Freq: Three times a day (TID) | ORAL | 0 refills | Status: DC | PRN

## 2020-09-20 MED ORDER — CYCLOBENZAPRINE HCL 10 MG PO TABS: 10.0000 mg | ORAL_TABLET | Freq: Every day | ORAL | 0 refills | Status: DC

## 2020-09-20 NOTE — Progress Notes (Signed)
Pt states he's been having a muscle spasms in his back on left  mid side near spine. Pain will radiate around the left ribcage X 5days. Heat and tylenol is not helping. Pt can feel his muscle twitching near his ribs.  Pain scale 1-10: 4 if sitting but standing 1.  First onset pain at 6 but its easing down over the days.

## 2020-09-20 NOTE — Progress Notes (Signed)
[facility] [address], [place] [address]   Office Visit Note  Patient Name: Brady Gilbert  [id number]  [id number]  Date of Service: 09/20/2020  Chief Complaint  Patient presents with   Back Pain     HPI Pt is here for acute visit.  He reports one week of back pain.  He describes it as a "catching feeling" when he tries to sit down.  He reports this happens to him about once a year.      Current Medication:  Outpatient Encounter Medications as of 09/20/2020  Medication Sig   Ascorbic Acid (VITAMIN C PO) Take by mouth daily.   Cholecalciferol (VITAMIN D3 PO) Take by mouth daily.   Cyanocobalamin (VITAMIN B-12 PO) Take by mouth daily.   cyclobenzaprine (FLEXERIL) 10 MG tablet Take 1 tablet (10 mg total) by mouth at bedtime.   ibuprofen (ADVIL) 800 MG tablet Take 1 tablet (800 mg total) by mouth every 8 (eight) hours as needed.   losartan (COZAAR) 100 MG tablet Take 1 tablet (100 mg total) by mouth daily.   Multiple Vitamins-Minerals (ZINC PO) Take by mouth daily.   No facility-administered encounter medications on file as of 09/20/2020.      Medical History: Past Medical History:  Diagnosis Date   Diverticulitis    Elevated lipids    Gout    Hypertension    Overweight      Vital Signs: BP 124/90   Pulse 95   Temp 97.7 F (36.5 C)   Resp 14   Ht 6\' 2"  (1.88 m)   Wt 275 lb (124.7 kg)   SpO2 97%   BMI 35.31 kg/m    Review of Systems  Constitutional:  Negative for activity change, chills, fatigue and fever.  Gastrointestinal:  Negative for abdominal distention and abdominal pain.  Genitourinary:  Negative for difficulty urinating, flank pain, frequency, genital sores and hematuria.  Musculoskeletal:  Positive for back pain.   Physical Exam Vitals and nursing note reviewed.  Musculoskeletal:     Cervical back: No spasms or tenderness. Normal range of motion.     Thoracic back: Spasms and tenderness present. No swelling or deformity.      Lumbar back: No swelling, deformity, spasms or tenderness.       Back:      Assessment/Plan: 1. Muscle spasm of back The patient is advised to apply heat intermittently (avoid sleeping on heating pad). 20 minutes on, 2 hours off.  Use advil and flexeril as discussed. Follow up if symptoms evolve, or fail to improve.  - ibuprofen (ADVIL) 800 MG tablet; Take 1 tablet (800 mg total) by mouth every 8 (eight) hours as needed.  Dispense: 30 tablet; Refill: 0 - cyclobenzaprine (FLEXERIL) 10 MG tablet; Take 1 tablet (10 mg total) by mouth at bedtime.  Dispense: 20 tablet; Refill: 0     General Counseling: aldo sondgeroth understanding of the findings of todays visit and agrees with plan of treatment. I have discussed any further diagnostic evaluation that may be needed or ordered today. We also reviewed his medications today. he has been encouraged to call the office with any questions or concerns that should arise related to todays visit.   No orders of the defined types were placed in this encounter.   Meds ordered this encounter  Medications   ibuprofen (ADVIL) 800 MG tablet    Sig: Take 1 tablet (800 mg total) by mouth every 8 (eight) hours as needed.  Dispense:  30 tablet    Refill:  0   cyclobenzaprine (FLEXERIL) 10 MG tablet    Sig: Take 1 tablet (10 mg total) by mouth at bedtime.    Dispense:  20 tablet    Refill:  0    Time spent:20 Minutes    Johnna Acosta AGNP-C Nurse Practitioner

## 2020-09-27 DIAGNOSIS — M9901 Segmental and somatic dysfunction of cervical region: Secondary | ICD-10-CM | POA: Diagnosis not present

## 2020-09-27 DIAGNOSIS — M9902 Segmental and somatic dysfunction of thoracic region: Secondary | ICD-10-CM | POA: Diagnosis not present

## 2020-09-27 DIAGNOSIS — M546 Pain in thoracic spine: Secondary | ICD-10-CM | POA: Diagnosis not present

## 2020-09-27 DIAGNOSIS — M542 Cervicalgia: Secondary | ICD-10-CM | POA: Diagnosis not present

## 2020-10-02 ENCOUNTER — Ambulatory Visit: Payer: Self-pay | Admitting: Adult Health

## 2020-10-04 DIAGNOSIS — M9902 Segmental and somatic dysfunction of thoracic region: Secondary | ICD-10-CM | POA: Diagnosis not present

## 2020-10-04 DIAGNOSIS — M542 Cervicalgia: Secondary | ICD-10-CM | POA: Diagnosis not present

## 2020-10-04 DIAGNOSIS — M546 Pain in thoracic spine: Secondary | ICD-10-CM | POA: Diagnosis not present

## 2020-10-04 DIAGNOSIS — M9901 Segmental and somatic dysfunction of cervical region: Secondary | ICD-10-CM | POA: Diagnosis not present

## 2020-10-11 DIAGNOSIS — R194 Change in bowel habit: Secondary | ICD-10-CM | POA: Diagnosis not present

## 2020-10-11 DIAGNOSIS — M542 Cervicalgia: Secondary | ICD-10-CM | POA: Diagnosis not present

## 2020-10-11 DIAGNOSIS — Z86010 Personal history of colonic polyps: Secondary | ICD-10-CM | POA: Diagnosis not present

## 2020-10-11 DIAGNOSIS — M546 Pain in thoracic spine: Secondary | ICD-10-CM | POA: Diagnosis not present

## 2020-10-11 DIAGNOSIS — M9902 Segmental and somatic dysfunction of thoracic region: Secondary | ICD-10-CM | POA: Diagnosis not present

## 2020-10-11 DIAGNOSIS — M9901 Segmental and somatic dysfunction of cervical region: Secondary | ICD-10-CM | POA: Diagnosis not present

## 2020-10-12 ENCOUNTER — Other Ambulatory Visit: Payer: Self-pay

## 2020-10-12 DIAGNOSIS — I10 Essential (primary) hypertension: Secondary | ICD-10-CM

## 2020-10-13 DIAGNOSIS — G4733 Obstructive sleep apnea (adult) (pediatric): Secondary | ICD-10-CM | POA: Diagnosis not present

## 2020-10-13 MED ORDER — LOSARTAN POTASSIUM 100 MG PO TABS: 100.0000 mg | ORAL_TABLET | Freq: Every day | ORAL | 2 refills | Status: DC

## 2020-11-13 DIAGNOSIS — G4733 Obstructive sleep apnea (adult) (pediatric): Secondary | ICD-10-CM | POA: Diagnosis not present

## 2020-12-14 DIAGNOSIS — G4733 Obstructive sleep apnea (adult) (pediatric): Secondary | ICD-10-CM | POA: Diagnosis not present

## 2021-01-10 ENCOUNTER — Ambulatory Visit: Admission: RE | Admit: 2021-01-10 | Payer: 59 | Source: Home / Self Care | Admitting: Internal Medicine

## 2021-01-10 ENCOUNTER — Encounter: Admission: RE | Payer: Self-pay | Source: Home / Self Care

## 2021-01-10 SURGERY — COLONOSCOPY WITH PROPOFOL
Anesthesia: General

## 2021-01-13 DIAGNOSIS — G4733 Obstructive sleep apnea (adult) (pediatric): Secondary | ICD-10-CM | POA: Diagnosis not present

## 2021-02-13 DIAGNOSIS — G4733 Obstructive sleep apnea (adult) (pediatric): Secondary | ICD-10-CM | POA: Diagnosis not present

## 2021-02-19 ENCOUNTER — Other Ambulatory Visit: Payer: Self-pay

## 2021-02-19 ENCOUNTER — Ambulatory Visit: Payer: Self-pay

## 2021-02-19 DIAGNOSIS — Z0000 Encounter for general adult medical examination without abnormal findings: Secondary | ICD-10-CM

## 2021-02-20 DIAGNOSIS — K640 First degree hemorrhoids: Secondary | ICD-10-CM | POA: Diagnosis not present

## 2021-02-20 DIAGNOSIS — Z86010 Personal history of colonic polyps: Secondary | ICD-10-CM | POA: Diagnosis not present

## 2021-02-20 DIAGNOSIS — K5730 Diverticulosis of large intestine without perforation or abscess without bleeding: Secondary | ICD-10-CM | POA: Diagnosis not present

## 2021-02-20 LAB — POCT URINALYSIS DIPSTICK
Bilirubin, UA: NEGATIVE
Blood, UA: NEGATIVE
Glucose, UA: NEGATIVE
Ketones, UA: NEGATIVE
Leukocytes, UA: NEGATIVE
Nitrite, UA: NEGATIVE
Protein, UA: NEGATIVE
Spec Grav, UA: 1.015 (ref 1.010–1.025)
Urobilinogen, UA: 0.2 E.U./dL
pH, UA: 6 (ref 5.0–8.0)

## 2021-02-20 LAB — CMP12+LP+TP+TSH+6AC+PSA+CBC…
ALT: 13 IU/L (ref 0–44)
AST: 13 IU/L (ref 0–40)
Albumin/Globulin Ratio: 1.9 (ref 1.2–2.2)
Albumin: 4.3 g/dL (ref 3.8–4.8)
Alkaline Phosphatase: 77 IU/L (ref 44–121)
BUN/Creatinine Ratio: 11 (ref 10–24)
BUN: 13 mg/dL (ref 8–27)
Basophils Absolute: 0.1 10*3/uL (ref 0.0–0.2)
Basos: 1 %
Bilirubin Total: 0.7 mg/dL (ref 0.0–1.2)
Calcium: 9.6 mg/dL (ref 8.6–10.2)
Chloride: 100 mmol/L (ref 96–106)
Chol/HDL Ratio: 4.6 ratio (ref 0.0–5.0)
Cholesterol, Total: 201 mg/dL — ABNORMAL HIGH (ref 100–199)
Creatinine, Ser: 1.19 mg/dL (ref 0.76–1.27)
EOS (ABSOLUTE): 0.1 10*3/uL (ref 0.0–0.4)
Eos: 2 %
Estimated CHD Risk: 0.9 times avg. (ref 0.0–1.0)
Free Thyroxine Index: 2.4 (ref 1.2–4.9)
GGT: 13 IU/L (ref 0–65)
Globulin, Total: 2.3 g/dL (ref 1.5–4.5)
Glucose: 103 mg/dL — ABNORMAL HIGH (ref 70–99)
HDL: 44 mg/dL (ref >39)
Hematocrit: 46.3 % (ref 37.5–51.0)
Hemoglobin: 16.4 g/dL (ref 13.0–17.7)
Immature Grans (Abs): 0 10*3/uL (ref 0.0–0.1)
Immature Granulocytes: 0 %
Iron: 95 ug/dL (ref 38–169)
LDH: 165 IU/L (ref 121–224)
LDL Chol Calc (NIH): 130 mg/dL — ABNORMAL HIGH (ref 0–99)
Lymphocytes Absolute: 2.3 10*3/uL (ref 0.7–3.1)
Lymphs: 31 %
MCH: 31.2 pg (ref 26.6–33.0)
MCHC: 35.4 g/dL (ref 31.5–35.7)
MCV: 88 fL (ref 79–97)
Monocytes Absolute: 0.6 10*3/uL (ref 0.1–0.9)
Monocytes: 7 %
Neutrophils Absolute: 4.5 10*3/uL (ref 1.4–7.0)
Neutrophils: 59 %
Phosphorus: 2.6 mg/dL — ABNORMAL LOW (ref 2.8–4.1)
Platelets: 221 10*3/uL (ref 150–450)
Potassium: 4.5 mmol/L (ref 3.5–5.2)
Prostate Specific Ag, Serum: 0.6 ng/mL (ref 0.0–4.0)
RBC: 5.26 x10E6/uL (ref 4.14–5.80)
RDW: 12.2 % (ref 11.6–15.4)
Sodium: 137 mmol/L (ref 134–144)
T3 Uptake Ratio: 31 % (ref 24–39)
T4, Total: 7.8 ug/dL (ref 4.5–12.0)
TSH: 2.19 u[IU]/mL (ref 0.450–4.500)
Total Protein: 6.6 g/dL (ref 6.0–8.5)
Triglycerides: 152 mg/dL — ABNORMAL HIGH (ref 0–149)
Uric Acid: 6.7 mg/dL (ref 3.8–8.4)
VLDL Cholesterol Cal: 27 mg/dL (ref 5–40)
WBC: 7.5 10*3/uL (ref 3.4–10.8)
eGFR: 69 mL/min/{1.73_m2} (ref >59)

## 2021-02-20 LAB — HGB A1C W/O EAG: Hgb A1c MFr Bld: 5.7 % — ABNORMAL HIGH (ref 4.8–5.6)

## 2021-02-26 ENCOUNTER — Ambulatory Visit: Payer: Self-pay | Admitting: Physician Assistant

## 2021-02-26 ENCOUNTER — Encounter: Payer: Self-pay | Admitting: Physician Assistant

## 2021-02-26 ENCOUNTER — Other Ambulatory Visit: Payer: Self-pay

## 2021-02-26 VITALS — BP 129/85 | HR 84 | Resp 14 | Wt 266.0 lb

## 2021-02-26 DIAGNOSIS — Z0000 Encounter for general adult medical examination without abnormal findings: Secondary | ICD-10-CM

## 2021-02-26 NOTE — Progress Notes (Signed)
   Subjective: Annual physical exam    Patient ID: Brady Gilbert, male    DOB: March 11, 1961, 59 y.o.   MRN: [id number]  HPI Patient presents for annual physical exam.  Patient states sleeping better at since he followed advised to have a sleep study.  Patient now using CPAP and her very restful nights.   Review of Systems Hypertension and sleep apnea    Objective:   Physical Exam No acute distress.  Temperature is 98.4, pulse 94, respiration 14, BP is 129/85, and patient is 98% O2 sat on room air.  Patient weighs 266 pounds. HEENT is unremarkable.  Neck is supple without lymphadenopathy or bruits.  Lungs are clear to auscultation.  Heart is regular rate and rhythm.  No acute findings on EKG. Abdomen with nonincarcerated hernia, normoactive bowel sounds, soft, nontender to palpation. No obvious deformity to upper or lower extremities.  Patient has full and equal range of motion in the upper and lower extremities. No obvious cervical or lumbar spine deformity.  Patient has full and equal range of motion of the cervical lumbar spine. Cranial nerves II through XII are grossly intact.  DTR 2+ without clonus.       Assessment & Plan: Well exam.   Discussed lab results with patient.  Follow-up as necessary.

## 2021-03-15 DIAGNOSIS — G4733 Obstructive sleep apnea (adult) (pediatric): Secondary | ICD-10-CM | POA: Diagnosis not present

## 2021-04-15 DIAGNOSIS — G4733 Obstructive sleep apnea (adult) (pediatric): Secondary | ICD-10-CM | POA: Diagnosis not present

## 2021-05-16 DIAGNOSIS — G4733 Obstructive sleep apnea (adult) (pediatric): Secondary | ICD-10-CM | POA: Diagnosis not present

## 2021-06-13 DIAGNOSIS — G4733 Obstructive sleep apnea (adult) (pediatric): Secondary | ICD-10-CM | POA: Diagnosis not present

## 2021-07-18 ENCOUNTER — Other Ambulatory Visit: Payer: Self-pay | Admitting: Physician Assistant

## 2021-07-18 DIAGNOSIS — I10 Essential (primary) hypertension: Secondary | ICD-10-CM

## 2021-08-15 ENCOUNTER — Other Ambulatory Visit: Payer: Self-pay

## 2021-08-15 NOTE — Telephone Encounter (Signed)
Bud called [facility] requesting Rx refill for Losartan 100 mg. ? ?Upon entering Rx refill info into Epic, a box opened for a pended Rx refill request for Losartan 100 mg sent electronically by patient's pharmacy to [facility] ED. ? ?Re-routed the pended Rx refill request to ?Durward Parcel, PA-C. ? ?AMD ?

## 2021-08-16 ENCOUNTER — Other Ambulatory Visit: Payer: Self-pay | Admitting: Physician Assistant

## 2021-08-16 DIAGNOSIS — I10 Essential (primary) hypertension: Secondary | ICD-10-CM

## 2021-08-16 MED ORDER — LOSARTAN POTASSIUM 100 MG PO TABS: 100.0000 mg | ORAL_TABLET | Freq: Every day | ORAL | 3 refills | Status: DC

## 2021-08-16 NOTE — Progress Notes (Signed)
Losartan 

## 2021-08-28 DIAGNOSIS — M25551 Pain in right hip: Secondary | ICD-10-CM | POA: Diagnosis not present

## 2021-08-28 DIAGNOSIS — M1611 Unilateral primary osteoarthritis, right hip: Secondary | ICD-10-CM | POA: Diagnosis not present

## 2021-10-31 DIAGNOSIS — I10 Essential (primary) hypertension: Secondary | ICD-10-CM | POA: Diagnosis not present

## 2021-10-31 DIAGNOSIS — R079 Chest pain, unspecified: Secondary | ICD-10-CM | POA: Diagnosis not present

## 2021-10-31 DIAGNOSIS — R002 Palpitations: Secondary | ICD-10-CM | POA: Diagnosis not present

## 2021-11-13 DIAGNOSIS — M1611 Unilateral primary osteoarthritis, right hip: Secondary | ICD-10-CM | POA: Diagnosis not present

## 2021-12-03 ENCOUNTER — Other Ambulatory Visit: Payer: Self-pay | Admitting: Orthopedic Surgery

## 2021-12-06 ENCOUNTER — Ambulatory Visit: Payer: Self-pay | Admitting: Physician Assistant

## 2021-12-06 ENCOUNTER — Encounter: Payer: Self-pay | Admitting: Physician Assistant

## 2021-12-06 VITALS — BP 135/76 | HR 77 | Temp 97.8°F | Resp 14 | Ht 74.0 in | Wt 271.0 lb

## 2021-12-06 DIAGNOSIS — Z01818 Encounter for other preprocedural examination: Secondary | ICD-10-CM

## 2021-12-06 NOTE — Progress Notes (Signed)
   Subjective: Medical clearance    Patient ID: Brady Gilbert, male    DOB: 1960-10-10, 61 y.o.   MRN: [id number]  HPI Patient is here today for medical clearance for pending right hip surgery for December 31, 2021.  Patient recently cleared by cardiology on 10/31/2021.   Review of Systems Heart palpitations, hypertension, and sleep apnea.    Objective:   Physical Exam  BP is 135/76, pulse 77, respiration 14, temperature 97.8, patient 97% O2 sat on room air.  Patient weighs 271 pounds and BMI is 34.79. HEENT is unremarkable.  Neck is supple without adenopathy or bruits.  Lungs are clear to auscultation.  Heart regular rate and rhythm.     Assessment & Plan: Medical clearance   Patient granted medical clearance for procedure.

## 2021-12-06 NOTE — Progress Notes (Signed)
Pt surgery for hip replacement sept 25th.

## 2021-12-13 DIAGNOSIS — M1611 Unilateral primary osteoarthritis, right hip: Secondary | ICD-10-CM | POA: Diagnosis not present

## 2021-12-13 DIAGNOSIS — M25651 Stiffness of right hip, not elsewhere classified: Secondary | ICD-10-CM | POA: Diagnosis not present

## 2021-12-13 DIAGNOSIS — R262 Difficulty in walking, not elsewhere classified: Secondary | ICD-10-CM | POA: Diagnosis not present

## 2021-12-19 NOTE — Patient Instructions (Signed)
SURGICAL WAITING ROOM VISITATION Patients having surgery or a procedure may have no more than 2 support people in the waiting area - these visitors may rotate.   Children under the age of 71 must have an adult with them who is not the patient. If the patient needs to stay at the hospital during part of their recovery, the visitor guidelines for inpatient rooms apply. Pre-op nurse will coordinate an appropriate time for 1 support person to accompany patient in pre-op.  This support person may not rotate.    Please refer to the [facility] website for the visitor guidelines for Inpatients (after your surgery is over and you are in a regular room).      Your procedure is scheduled on: 12-31-21   Report to [facility] Main Entrance    Report to admitting at 7:15 AM   Call this number if you have problems the morning of surgery [phone]   Do not eat food :After Midnight.   After Midnight you may have the following liquids until 6:40 AM DAY OF SURGERY  Water Non-Citrus Juices (without pulp, NO RED) Carbonated Beverages Black Coffee (NO MILK/CREAM OR CREAMERS, sugar ok)  Clear Tea (NO MILK/CREAM OR CREAMERS, sugar ok) regular and decaf                             Plain Jell-O (NO RED)                                           Fruit ices (not with fruit pulp, NO RED)                                     Popsicles (NO RED)                                                               Sports drinks like Gatorade (NO RED)                   The day of surgery:  Drink ONE (1) Pre-Surgery Clear Ensure at 6:40 AM the morning of surgery. Drink in one sitting. Do not sip.  This drink was given to you during your hospital  pre-op appointment visit. Nothing else to drink after completing the Pre-Surgery Clear Ensure .          If you have questions, please contact your surgeon's office.   FOLLOW  ANY ADDITIONAL PRE OP INSTRUCTIONS YOU RECEIVED FROM YOUR SURGEON'S OFFICE!!!      Oral Hygiene is also important to reduce your risk of infection.                                    Remember - BRUSH YOUR TEETH THE MORNING OF SURGERY WITH YOUR REGULAR TOOTHPASTE   Do NOT smoke after Midnight   Take these medicines the morning of surgery with A SIP OF WATER: None  Bring CPAP mask and tubing day of surgery.  You may not have any metal on your body including  jewelry, and body piercing             Do not wear lotions, powders, cologne or deodorant              Men may shave face and neck.   Do not bring valuables to the hospital. [facility] IS NOT RESPONSIBLE   FOR VALUABLES.   Contacts, dentures or bridgework may not be worn into surgery.  DO NOT BRING YOUR HOME MEDICATIONS TO THE HOSPITAL. PHARMACY WILL DISPENSE MEDICATIONS LISTED ON YOUR MEDICATION LIST TO YOU DURING YOUR ADMISSION IN THE HOSPITAL!   Patients discharged on the day of surgery will not be allowed to drive home.  Someone NEEDS to stay with you for the first 24 hours after anesthesia.  Special Instructions: Bring a copy of your healthcare power of attorney and living will documents the day of surgery if you haven't scanned them before.  Please read over the following fact sheets you were given: IF YOU HAVE QUESTIONS ABOUT YOUR PRE-OP INSTRUCTIONS PLEASE CALL [phone] [facility] - Preparing for Surgery Before surgery, you can play an important role.  Because skin is not sterile, your skin needs to be as free of germs as possible.  You can reduce the number of germs on your skin by washing with CHG (chlorahexidine gluconate) soap before surgery.  CHG is an antiseptic cleaner which kills germs and bonds with the skin to continue killing germs even after washing. Please DO NOT use if you have an allergy to CHG or antibacterial soaps.  If your skin becomes reddened/irritated stop using the CHG and inform your nurse when you arrive at Short Stay. Do not shave  (including legs and underarms) for at least 48 hours prior to the first CHG shower.  You may shave your face/neck.  Please follow these instructions carefully:  1.  Shower with CHG Soap the night before surgery and the  morning of surgery.  2.  If you choose to wash your hair, wash your hair first as usual with your normal  shampoo.  3.  After you shampoo, rinse your hair and body thoroughly to remove the shampoo.                             4.  Use CHG as you would any other liquid soap.  You can apply chg directly to the skin and wash.  Gently with a scrungie or clean washcloth.  5.  Apply the CHG Soap to your body ONLY FROM THE NECK DOWN.   Do   not use on face/ open                           Wound or open sores. Avoid contact with eyes, ears mouth and   genitals (private parts).                       Wash face,  Genitals (private parts) with your normal soap.             6.  Wash thoroughly, paying special attention to the area where your    surgery  will be performed.  7.  Thoroughly rinse your body with warm water from the neck down.  8.  DO NOT shower/wash with your normal soap after using and rinsing off  the CHG Soap.                9.  Pat yourself dry with a clean towel.            10.  Wear clean pajamas.            11.  Place clean sheets on your bed the night of your first shower and do not  sleep with pets. Day of Surgery : Do not apply any lotions/deodorants the morning of surgery.  Please wear clean clothes to the hospital/surgery center.  FAILURE TO FOLLOW THESE INSTRUCTIONS MAY RESULT IN THE CANCELLATION OF YOUR SURGERY  PATIENT SIGNATURE_________________________________  NURSE SIGNATURE__________________________________  ________________________________________________________________________     Brady Gilbert  An incentive spirometer is a tool that can help keep your lungs clear and active. This tool measures how well you are filling your lungs with each  breath. Taking long deep breaths may help reverse or decrease the chance of developing breathing (pulmonary) problems (especially infection) following: A long period of time when you are unable to move or be active. BEFORE THE PROCEDURE  If the spirometer includes an indicator to show your best effort, your nurse or respiratory therapist will set it to a desired goal. If possible, sit up straight or lean slightly forward. Try not to slouch. Hold the incentive spirometer in an upright position. INSTRUCTIONS FOR USE  Sit on the edge of your bed if possible, or sit up as far as you can in bed or on a chair. Hold the incentive spirometer in an upright position. Breathe out normally. Place the mouthpiece in your mouth and seal your lips tightly around it. Breathe in slowly and as deeply as possible, raising the piston or the ball toward the top of the column. Hold your breath for 3-5 seconds or for as long as possible. Allow the piston or ball to fall to the bottom of the column. Remove the mouthpiece from your mouth and breathe out normally. Rest for a few seconds and repeat Steps 1 through 7 at least 10 times every 1-2 hours when you are awake. Take your time and take a few normal breaths between deep breaths. The spirometer may include an indicator to show your best effort. Use the indicator as a goal to work toward during each repetition. After each set of 10 deep breaths, practice coughing to be sure your lungs are clear. If you have an incision (the cut made at the time of surgery), support your incision when coughing by placing a pillow or rolled up towels firmly against it. Once you are able to get out of bed, walk around indoors and cough well. You may stop using the incentive spirometer when instructed by your caregiver.  RISKS AND COMPLICATIONS Take your time so you do not get dizzy or light-headed. If you are in pain, you may need to take or ask for pain medication before doing incentive  spirometry. It is harder to take a deep breath if you are having pain. AFTER USE Rest and breathe slowly and easily. It can be helpful to keep track of a log of your progress. Your caregiver can provide you with a simple table to help with this. If you are using the spirometer at home, follow these instructions: [place] IF:  You are having difficultly using the spirometer. You have trouble using the spirometer as often as instructed. Your pain medication is not giving enough relief while using the spirometer. You develop  fever of 100.5 F (38.1 C) or higher. SEEK IMMEDIATE MEDICAL CARE IF:  You cough up bloody sputum that had not been present before. You develop fever of 102 F (38.9 C) or greater. You develop worsening pain at or near the incision site. MAKE SURE YOU:  Understand these instructions. Will watch your condition. Will get help right away if you are not doing well or get worse. Document Released: 08/05/2006 Document Revised: 06/17/2011 Document Reviewed: 10/06/2006 [employer] Patient Information 2014 [employer], [place].   ________________________________________________________________________  WHAT IS A BLOOD TRANSFUSION? Blood Transfusion Information  A transfusion is the replacement of blood or some of its parts. Blood is made up of multiple cells which provide different functions. Red blood cells carry oxygen and are used for blood loss replacement. White blood cells fight against infection. Platelets control bleeding. Plasma helps clot blood. Other blood products are available for specialized needs, such as hemophilia or other clotting disorders. BEFORE THE TRANSFUSION  Who gives blood for transfusions?  Healthy volunteers who are fully evaluated to make sure their blood is safe. This is blood bank blood. Transfusion therapy is the safest it has ever been in the practice of medicine. Before blood is taken from a donor, a complete history is taken to make  sure that person has no history of diseases nor engages in risky social behavior (examples are intravenous drug use or sexual activity with multiple partners). The donor's travel history is screened to minimize risk of transmitting infections, such as malaria. The donated blood is tested for signs of infectious diseases, such as HIV and hepatitis. The blood is then tested to be sure it is compatible with you in order to minimize the chance of a transfusion reaction. If you or a relative donates blood, this is often done in anticipation of surgery and is not appropriate for emergency situations. It takes many days to process the donated blood. RISKS AND COMPLICATIONS Although transfusion therapy is very safe and saves many lives, the main dangers of transfusion include:  Getting an infectious disease. Developing a transfusion reaction. This is an allergic reaction to something in the blood you were given. Every precaution is taken to prevent this. The decision to have a blood transfusion has been considered carefully by your caregiver before blood is given. Blood is not given unless the benefits outweigh the risks. AFTER THE TRANSFUSION Right after receiving a blood transfusion, you will usually feel much better and more energetic. This is especially true if your red blood cells have gotten low (anemic). The transfusion raises the level of the red blood cells which carry oxygen, and this usually causes an energy increase. The nurse administering the transfusion will monitor you carefully for complications. HOME CARE INSTRUCTIONS  No special instructions are needed after a transfusion. You may find your energy is better. Speak with your caregiver about any limitations on activity for underlying diseases you may have. SEEK MEDICAL CARE IF:  Your condition is not improving after your transfusion. You develop redness or irritation at the intravenous (IV) site. SEEK IMMEDIATE MEDICAL CARE IF:  Any of the  following symptoms occur over the next 12 hours: Shaking chills. You have a temperature by mouth above 102 F (38.9 C), not controlled by medicine. Chest, back, or muscle pain. People around you feel you are not acting correctly or are confused. Shortness of breath or difficulty breathing. Dizziness and fainting. You get a rash or develop hives. You have a decrease in urine output. Your urine turns  a dark color or changes to pink, red, or brown. Any of the following symptoms occur over the next 10 days: You have a temperature by mouth above 102 F (38.9 C), not controlled by medicine. Shortness of breath. Weakness after normal activity. The white part of the eye turns yellow (jaundice). You have a decrease in the amount of urine or are urinating less often. Your urine turns a dark color or changes to pink, red, or brown. Document Released: 03/22/2000 Document Revised: 06/17/2011 Document Reviewed: 11/09/2007 [facility] Patient Information 2014 [place], [place].  _______________________________________________________________________

## 2021-12-19 NOTE — Progress Notes (Addendum)
COVID Vaccine Completed:No  Date of COVID positive in last 90 days:  No  PCP - Nona Dell, PA-C Cardiologist - Geralynn Ochs, MD  Chest x-ray - 12-21-21 Epic EKG - 02-19-21 Epic Stress Test - 2019 [facility] ECHO - N/A Cardiac Cath - N/A Pacemaker/ICD device last checked: Spinal Cord Stimulator:  Bowel Prep - N/A  Sleep Study - Yes, +sleep apnea CPAP - Yes  Fasting Blood Sugar - N/A Checks Blood Sugar _____ times a day  Blood Thinner Instructions: N/A Aspirin Instructions: Last Dose:  Activity level:  Can go up a flight of stairs and perform activities of daily living without stopping and without symptoms of chest pain or shortness of breath.  Able to exercise without symptoms  Anesthesia review:  Tachycardia and palpitations evaluated by cardiology  Patient denies shortness of breath, fever, cough and chest pain at PAT appointment  Patient verbalized understanding of instructions that were given to them at the PAT appointment. Patient was also instructed that they will need to review over the PAT instructions again at home before surgery.

## 2021-12-21 ENCOUNTER — Ambulatory Visit (HOSPITAL_COMMUNITY)
Admission: RE | Admit: 2021-12-21 | Discharge: 2021-12-21 | Disposition: A | Discharge status: Home / Self Care | Payer: 59 | Source: Ambulatory Visit | Attending: Orthopedic Surgery | Admitting: Orthopedic Surgery

## 2021-12-21 ENCOUNTER — Encounter (HOSPITAL_COMMUNITY)
Admission: RE | Admit: 2021-12-21 | Discharge: 2021-12-21 | Disposition: A | Discharge status: Home / Self Care | Payer: 59 | Source: Ambulatory Visit | Attending: Orthopedic Surgery | Admitting: Orthopedic Surgery

## 2021-12-21 ENCOUNTER — Encounter (HOSPITAL_COMMUNITY): Payer: Self-pay

## 2021-12-21 ENCOUNTER — Other Ambulatory Visit: Payer: Self-pay

## 2021-12-21 VITALS — BP 136/93 | HR 65 | Temp 98.2°F | Resp 16 | Ht 74.0 in | Wt 281.0 lb

## 2021-12-21 DIAGNOSIS — Z01818 Encounter for other preprocedural examination: Secondary | ICD-10-CM

## 2021-12-21 DIAGNOSIS — I2510 Atherosclerotic heart disease of native coronary artery without angina pectoris: Secondary | ICD-10-CM

## 2021-12-21 HISTORY — DX: Unspecified osteoarthritis, unspecified site: M19.90

## 2021-12-21 HISTORY — DX: Personal history of urinary calculi: Z87.442

## 2021-12-21 HISTORY — DX: Sleep apnea, unspecified: G47.30

## 2021-12-21 LAB — CBC
HCT: 45.7 % (ref 39.0–52.0)
Hemoglobin: 15.4 g/dL (ref 13.0–17.0)
MCH: 30.9 pg (ref 26.0–34.0)
MCHC: 33.7 g/dL (ref 30.0–36.0)
MCV: 91.6 fL (ref 80.0–100.0)
Platelets: 241 10*3/uL (ref 150–400)
RBC: 4.99 MIL/uL (ref 4.22–5.81)
RDW: 12.5 % (ref 11.5–15.5)
WBC: 6.7 10*3/uL (ref 4.0–10.5)
nRBC: 0 % (ref 0.0–0.2)

## 2021-12-21 LAB — SURGICAL PCR SCREEN
MRSA, PCR: NEGATIVE
Staphylococcus aureus: NEGATIVE

## 2021-12-28 DIAGNOSIS — M1611 Unilateral primary osteoarthritis, right hip: Secondary | ICD-10-CM | POA: Diagnosis present

## 2021-12-28 NOTE — H&P (Signed)
TOTAL HIP ADMISSION H&P  Patient is admitted for right total hip arthroplasty.  Subjective:  Chief Complaint: right hip pain  HPI: Brady Gilbert, 61 y.o. male, has a history of pain and functional disability in the right hip(s) due to arthritis and patient has failed non-surgical conservative treatments for greater than 12 weeks to include NSAID's and/or analgesics, corticosteriod injections, flexibility and strengthening excercises, use of assistive devices, weight reduction as appropriate, and activity modification.  Onset of symptoms was gradual starting 1 years ago with gradually worsening course since that time.The patient noted no past surgery on the right hip(s).  Patient currently rates pain in the right hip at 10 out of 10 with activity. Patient has night pain, worsening of pain with activity and weight bearing, trendelenberg gait, pain that interfers with activities of daily living, and pain with passive range of motion. Patient has evidence of subchondral sclerosis and joint space narrowing by imaging studies. This condition presents safety issues increasing the risk of falls. There is no current active infection.  Patient Active Problem List   Diagnosis Date Noted   Primary hypertension 10/31/2021   Chest pain at rest 11/24/2017   Palpitations 11/24/2017   Dermatochalasis of eyelid 11/18/2017   Ptosis, left 11/18/2017   Visual field defect 11/18/2017   Past Medical History:  Diagnosis Date   Arthritis    Diverticulitis    Elevated lipids    Gout    History of kidney stones    Hypertension    Overweight    Sleep apnea     Past Surgical History:  Procedure Laterality Date   [place]  12/2017   COLONOSCOPY     lasix      No current facility-administered medications for this encounter.   Current Outpatient Medications  Medication Sig Dispense Refill Last Dose   losartan (COZAAR) 100 MG tablet Take 50 mg by mouth daily.      Polyethyl  Glycol-Propyl Glycol (SYSTANE OP) Place 1 drop into both eyes as needed (dry eyes).      No Known Allergies  Social History   Tobacco Use   Smoking status: Never   Smokeless tobacco: Former    Types: Chew  Substance Use Topics   Alcohol use: Not Currently    Family History  Problem Relation Age of Onset   Heart disease Father      Review of Systems  Constitutional: Negative.   HENT: Negative.    Eyes: Negative.   Respiratory: Negative.    Cardiovascular: Negative.   Gastrointestinal: Negative.   Endocrine: Negative.   Genitourinary: Negative.   Musculoskeletal:  Positive for arthralgias.  Allergic/Immunologic: Negative.   Neurological: Negative.   Hematological: Negative.   Psychiatric/Behavioral: Negative.      Objective:  Physical Exam Constitutional:      Appearance: Normal appearance. He is normal weight.  HENT:     Head: Normocephalic and atraumatic.     Nose: Nose normal.     Mouth/Throat:     Mouth: Mucous membranes are moist.     Pharynx: Oropharynx is clear.  Eyes:     Pupils: Pupils are equal, round, and reactive to light.  Cardiovascular:     Pulses: Normal pulses.  Pulmonary:     Effort: Pulmonary effort is normal.  Musculoskeletal:        General: Tenderness present.     Cervical back: Normal range of motion.     Comments: Patient walks with a right-sided  limp.  Internal rotation of the right hip past 20 causes severe pain.  External rotation less so foot tap is negative power to testing of hip flexors extensors abductors and abductors.  He is neurologically intact distally toes are pink and well perfused.    Skin:    General: Skin is warm and dry.  Neurological:     General: No focal deficit present.     Mental Status: He is alert and oriented to person, place, and time. Mental status is at baseline.  Psychiatric:        Mood and Affect: Mood normal.        Behavior: Behavior normal.        Thought Content: Thought content normal.         Judgment: Judgment normal.     Vital signs in last 24 hours:    Labs:   Estimated body mass index is 36.08 kg/m as calculated from the following:   Height as of 12/21/21: 6\' 2"  (1.88 m).   Weight as of 12/21/21: 127.5 kg.   Imaging Review Plain radiographs demonstrate  bone-on-bone arthritic changes to the superior weightbearing dome of the right hip joint subchondral sclerosis, lateral subluxation of the femoral head.   Assessment/Plan:  End stage arthritis, right hip(s)  The patient history, physical examination, clinical judgement of the provider and imaging studies are consistent with end stage degenerative joint disease of the right hip(s) and total hip arthroplasty is deemed medically necessary. The treatment options including medical management, injection therapy, arthroscopy and arthroplasty were discussed at length. The risks and benefits of total hip arthroplasty were presented and reviewed. The risks due to aseptic loosening, infection, stiffness, dislocation/subluxation,  thromboembolic complications and other imponderables were discussed.  The patient acknowledged the explanation, agreed to proceed with the plan and consent was signed. Patient is being admitted for inpatient treatment for surgery, pain control, PT, OT, prophylactic antibiotics, VTE prophylaxis, progressive ambulation and ADL's and discharge planning.The patient is planning to be discharged home with home health services    Patient's anticipated LOS is less than 2 midnights, meeting these requirements: - Younger than 73 - Lives within 1 hour of care - Has a competent adult at home to recover with post-op recover - NO history of  - Chronic pain requiring opiods  - Diabetes  - Coronary Artery Disease  - Heart failure  - Heart attack  - Stroke  - DVT/VTE  - Cardiac arrhythmia  - Respiratory Failure/COPD  - Renal failure  - Anemia  - Advanced Liver disease

## 2021-12-29 NOTE — Anesthesia Preprocedure Evaluation (Addendum)
Anesthesia Evaluation  Patient identified by MRN, date of birth, ID band Patient awake    Reviewed: Allergy & Precautions, NPO status , Patient's Chart, lab work & pertinent test results  History of Anesthesia Complications Negative for: history of anesthetic complications  Airway Mallampati: III  TM Distance: >3 FB Neck ROM: Full    Dental  (+) Dental Advisory Given, Teeth Intact   Pulmonary sleep apnea and Continuous Positive Airway Pressure Ventilation ,    Pulmonary exam normal        Cardiovascular hypertension, Pt. on medications Normal cardiovascular exam     Neuro/Psych negative neurological ROS  negative psych ROS   GI/Hepatic negative GI ROS, Neg liver ROS,   Endo/Other   Obesity   Renal/GU negative Renal ROS     Musculoskeletal  (+) Arthritis , Osteoarthritis,   Gout    Abdominal   Peds  Hematology negative hematology ROS (+)   Anesthesia Other Findings Left sided ptosis   Reproductive/Obstetrics                            Anesthesia Physical Anesthesia Plan  ASA: 2  Anesthesia Plan: Spinal   Post-op Pain Management: Tylenol PO (pre-op)* and Celebrex PO (pre-op)*   Induction:   PONV Risk Score and Plan: 1 and Treatment may vary due to age or medical condition and Propofol infusion  Airway Management Planned: Natural Airway and Simple Face Mask  Additional Equipment: None  Intra-op Plan:   Post-operative Plan:   Informed Consent: I have reviewed the patients History and Physical, chart, labs and discussed the procedure including the risks, benefits and alternatives for the proposed anesthesia with the patient or authorized representative who has indicated his/her understanding and acceptance.       Plan Discussed with: CRNA and Anesthesiologist  Anesthesia Plan Comments: (Labs reviewed, platelets acceptable. Discussed risks and benefits of spinal,  including spinal/epidural hematoma, infection, failed block, and PDPH. Patient expressed understanding and wished to proceed. )       Anesthesia Quick Evaluation

## 2021-12-31 ENCOUNTER — Ambulatory Visit (HOSPITAL_COMMUNITY)
Admission: RE | Admit: 2021-12-31 | Discharge: 2021-12-31 | Disposition: A | Discharge status: Home / Self Care | Payer: 59 | Attending: Orthopedic Surgery | Admitting: Orthopedic Surgery

## 2021-12-31 ENCOUNTER — Ambulatory Visit (HOSPITAL_COMMUNITY): Payer: 59

## 2021-12-31 ENCOUNTER — Encounter (HOSPITAL_COMMUNITY)
Admission: RE | Disposition: A | Discharge status: Home / Self Care | Payer: Self-pay | Source: Home / Self Care | Attending: Orthopedic Surgery

## 2021-12-31 ENCOUNTER — Encounter (HOSPITAL_COMMUNITY): Payer: Self-pay | Admitting: Orthopedic Surgery

## 2021-12-31 ENCOUNTER — Other Ambulatory Visit: Payer: Self-pay

## 2021-12-31 ENCOUNTER — Ambulatory Visit (HOSPITAL_BASED_OUTPATIENT_CLINIC_OR_DEPARTMENT_OTHER): Payer: 59 | Admitting: Anesthesiology

## 2021-12-31 ENCOUNTER — Ambulatory Visit (HOSPITAL_COMMUNITY): Payer: 59 | Admitting: Physician Assistant

## 2021-12-31 DIAGNOSIS — M1611 Unilateral primary osteoarthritis, right hip: Secondary | ICD-10-CM

## 2021-12-31 DIAGNOSIS — Z6836 Body mass index (BMI) 36.0-36.9, adult: Secondary | ICD-10-CM | POA: Insufficient documentation

## 2021-12-31 DIAGNOSIS — I2510 Atherosclerotic heart disease of native coronary artery without angina pectoris: Secondary | ICD-10-CM

## 2021-12-31 DIAGNOSIS — M109 Gout, unspecified: Secondary | ICD-10-CM | POA: Diagnosis not present

## 2021-12-31 DIAGNOSIS — G4730 Sleep apnea, unspecified: Secondary | ICD-10-CM | POA: Insufficient documentation

## 2021-12-31 DIAGNOSIS — Z471 Aftercare following joint replacement surgery: Secondary | ICD-10-CM | POA: Diagnosis not present

## 2021-12-31 DIAGNOSIS — I10 Essential (primary) hypertension: Secondary | ICD-10-CM | POA: Diagnosis not present

## 2021-12-31 DIAGNOSIS — E669 Obesity, unspecified: Secondary | ICD-10-CM | POA: Insufficient documentation

## 2021-12-31 DIAGNOSIS — Z96641 Presence of right artificial hip joint: Secondary | ICD-10-CM | POA: Diagnosis not present

## 2021-12-31 HISTORY — PX: TOTAL HIP ARTHROPLASTY: SHX124

## 2021-12-31 LAB — BASIC METABOLIC PANEL
Anion gap: 8 (ref 5–15)
BUN: 17 mg/dL (ref 8–23)
CO2: 24 mmol/L (ref 22–32)
Calcium: 8.9 mg/dL (ref 8.9–10.3)
Chloride: 108 mmol/L (ref 98–111)
Creatinine, Ser: 1.12 mg/dL (ref 0.61–1.24)
GFR, Estimated: >60 mL/min (ref >60)
Glucose, Bld: 114 mg/dL — ABNORMAL HIGH (ref 70–99)
Potassium: 4.5 mmol/L (ref 3.5–5.1)
Sodium: 140 mmol/L (ref 135–145)

## 2021-12-31 LAB — TYPE AND SCREEN
ABO/RH(D): A POS
Antibody Screen: NEGATIVE

## 2021-12-31 LAB — ABO/RH: ABO/RH(D): A POS

## 2021-12-31 SURGERY — ARTHROPLASTY, HIP, TOTAL, ANTERIOR APPROACH
Anesthesia: Spinal | Site: Hip | Laterality: Right

## 2021-12-31 MED ORDER — BISACODYL 5 MG PO TBEC: 5.0000 mg | DELAYED_RELEASE_TABLET | Freq: Every day | ORAL | Status: DC | PRN

## 2021-12-31 MED ORDER — PHENYLEPHRINE HCL-NACL 20-0.9 MG/250ML-% IV SOLN
INTRAVENOUS | Status: DC | PRN
  Administered 2021-12-31: 35 ug/min via INTRAVENOUS

## 2021-12-31 MED ORDER — PROPOFOL 500 MG/50ML IV EMUL
INTRAVENOUS | Status: DC | PRN
  Administered 2021-12-31: 40 ug/kg/min via INTRAVENOUS

## 2021-12-31 MED ORDER — BUPIVACAINE IN DEXTROSE 0.75-8.25 % IT SOLN
INTRATHECAL | Status: DC | PRN
  Administered 2021-12-31: 1.8 mL via INTRATHECAL

## 2021-12-31 MED ORDER — OXYCODONE HCL 5 MG PO TABS
ORAL_TABLET | ORAL | Status: AC
  Administered 2021-12-31: 5 mg via ORAL
  Filled 2021-12-31: qty 1

## 2021-12-31 MED ORDER — PHENYLEPHRINE 80 MCG/ML (10ML) SYRINGE FOR IV PUSH (FOR BLOOD PRESSURE SUPPORT)
PREFILLED_SYRINGE | INTRAVENOUS | Status: DC | PRN
  Administered 2021-12-31 (×8): 80 ug via INTRAVENOUS

## 2021-12-31 MED ORDER — POLYETHYLENE GLYCOL 3350 17 G PO PACK: 17.0000 g | PACK | Freq: Every day | ORAL | Status: DC | PRN

## 2021-12-31 MED ORDER — BUPIVACAINE-EPINEPHRINE (PF) 0.25% -1:200000 IJ SOLN
INTRAMUSCULAR | Status: AC
  Filled 2021-12-31: qty 30

## 2021-12-31 MED ORDER — DOCUSATE SODIUM 100 MG PO CAPS: 100.0000 mg | ORAL_CAPSULE | Freq: Two times a day (BID) | ORAL | Status: DC

## 2021-12-31 MED ORDER — BUPIVACAINE-EPINEPHRINE 0.25% -1:200000 IJ SOLN
INTRAMUSCULAR | Status: DC | PRN
  Administered 2021-12-31: 30 mL

## 2021-12-31 MED ORDER — MENTHOL 3 MG MT LOZG: 1.0000 | LOZENGE | OROMUCOSAL | Status: DC | PRN

## 2021-12-31 MED ORDER — ACETAMINOPHEN 500 MG PO TABS
ORAL_TABLET | ORAL | Status: AC
  Administered 2021-12-31: 1000 mg via ORAL
  Filled 2021-12-31: qty 2

## 2021-12-31 MED ORDER — ONDANSETRON HCL 4 MG/2ML IJ SOLN
INTRAMUSCULAR | Status: DC | PRN
  Administered 2021-12-31: 4 mg via INTRAVENOUS

## 2021-12-31 MED ORDER — SODIUM CHLORIDE (PF) 0.9 % IJ SOLN
INTRAMUSCULAR | Status: AC
  Filled 2021-12-31: qty 50

## 2021-12-31 MED ORDER — PHENYLEPHRINE HCL (PRESSORS) 10 MG/ML IV SOLN
INTRAVENOUS | Status: AC
  Filled 2021-12-31: qty 1

## 2021-12-31 MED ORDER — ALBUMIN HUMAN 5 % IV SOLN
INTRAVENOUS | Status: AC
  Filled 2021-12-31: qty 250

## 2021-12-31 MED ORDER — BUPIVACAINE LIPOSOME 1.3 % IJ SUSP
INTRAMUSCULAR | Status: DC | PRN
  Administered 2021-12-31: 10 mL

## 2021-12-31 MED ORDER — MIDAZOLAM HCL 2 MG/2ML IJ SOLN
INTRAMUSCULAR | Status: AC
  Filled 2021-12-31: qty 2

## 2021-12-31 MED ORDER — PROPOFOL 10 MG/ML IV BOLUS
INTRAVENOUS | Status: DC | PRN
  Administered 2021-12-31: 25 mg via INTRAVENOUS

## 2021-12-31 MED ORDER — TRANEXAMIC ACID-NACL 1000-0.7 MG/100ML-% IV SOLN: 1000.0000 mg | Freq: Once | INTRAVENOUS | Status: DC

## 2021-12-31 MED ORDER — PHENYLEPHRINE 80 MCG/ML (10ML) SYRINGE FOR IV PUSH (FOR BLOOD PRESSURE SUPPORT)
PREFILLED_SYRINGE | INTRAVENOUS | Status: AC
  Filled 2021-12-31: qty 10

## 2021-12-31 MED ORDER — CELECOXIB 200 MG PO CAPS
ORAL_CAPSULE | ORAL | Status: AC
  Administered 2021-12-31: 200 mg via ORAL
  Filled 2021-12-31: qty 1

## 2021-12-31 MED ORDER — ALUM & MAG HYDROXIDE-SIMETH 200-200-20 MG/5ML PO SUSP: 30.0000 mL | ORAL | Status: DC | PRN

## 2021-12-31 MED ORDER — TIZANIDINE HCL 2 MG PO CAPS: 4.0000 mg | ORAL_CAPSULE | Freq: Three times a day (TID) | ORAL | 0 refills | Status: DC | PRN

## 2021-12-31 MED ORDER — LOSARTAN POTASSIUM 50 MG PO TABS: 50.0000 mg | ORAL_TABLET | Freq: Every day | ORAL | Status: DC

## 2021-12-31 MED ORDER — CELECOXIB 200 MG PO CAPS: 200.0000 mg | ORAL_CAPSULE | Freq: Once | ORAL | Status: AC

## 2021-12-31 MED ORDER — FENTANYL CITRATE PF 50 MCG/ML IJ SOSY: 25.0000 ug | PREFILLED_SYRINGE | INTRAMUSCULAR | Status: DC | PRN

## 2021-12-31 MED ORDER — ONDANSETRON HCL 4 MG/2ML IJ SOLN: 4.0000 mg | Freq: Once | INTRAMUSCULAR | Status: DC | PRN

## 2021-12-31 MED ORDER — ONDANSETRON HCL 4 MG/2ML IJ SOLN
INTRAMUSCULAR | Status: AC
  Filled 2021-12-31: qty 2

## 2021-12-31 MED ORDER — BUPIVACAINE LIPOSOME 1.3 % IJ SUSP
INTRAMUSCULAR | Status: AC
  Filled 2021-12-31: qty 10

## 2021-12-31 MED ORDER — DIPHENHYDRAMINE HCL 12.5 MG/5ML PO ELIX: 12.5000 mg | ORAL_SOLUTION | ORAL | Status: DC | PRN

## 2021-12-31 MED ORDER — METOCLOPRAMIDE HCL 5 MG/ML IJ SOLN: 5.0000 mg | Freq: Three times a day (TID) | INTRAMUSCULAR | Status: DC | PRN

## 2021-12-31 MED ORDER — TRANEXAMIC ACID-NACL 1000-0.7 MG/100ML-% IV SOLN
1000.0000 mg | INTRAVENOUS | Status: AC
  Administered 2021-12-31: 1000 mg via INTRAVENOUS

## 2021-12-31 MED ORDER — FENTANYL CITRATE (PF) 100 MCG/2ML IJ SOLN
INTRAMUSCULAR | Status: AC
  Filled 2021-12-31: qty 2

## 2021-12-31 MED ORDER — ONDANSETRON HCL 4 MG/2ML IJ SOLN: 4.0000 mg | Freq: Four times a day (QID) | INTRAMUSCULAR | Status: DC | PRN

## 2021-12-31 MED ORDER — LACTATED RINGERS IV SOLN: INTRAVENOUS | Status: DC

## 2021-12-31 MED ORDER — ALBUMIN HUMAN 5 % IV SOLN: INTRAVENOUS | Status: DC | PRN

## 2021-12-31 MED ORDER — HYDROMORPHONE HCL 2 MG PO TABS: 2.0000 mg | ORAL_TABLET | ORAL | Status: DC | PRN

## 2021-12-31 MED ORDER — ONDANSETRON HCL 4 MG PO TABS: 4.0000 mg | ORAL_TABLET | Freq: Four times a day (QID) | ORAL | Status: DC | PRN

## 2021-12-31 MED ORDER — PROPOFOL 10 MG/ML IV BOLUS
INTRAVENOUS | Status: AC
  Filled 2021-12-31: qty 20

## 2021-12-31 MED ORDER — HYDROMORPHONE HCL 1 MG/ML IJ SOLN
0.5000 mg | INTRAMUSCULAR | Status: AC | PRN
  Administered 2021-12-31: 0.5 mg via INTRAVENOUS

## 2021-12-31 MED ORDER — PROPOFOL 1000 MG/100ML IV EMUL
INTRAVENOUS | Status: AC
  Filled 2021-12-31: qty 100

## 2021-12-31 MED ORDER — CEFAZOLIN SODIUM-DEXTROSE 2-4 GM/100ML-% IV SOLN
INTRAVENOUS | Status: AC
  Filled 2021-12-31: qty 100

## 2021-12-31 MED ORDER — 0.9 % SODIUM CHLORIDE (POUR BTL) OPTIME
TOPICAL | Status: DC | PRN
  Administered 2021-12-31: 1000 mL

## 2021-12-31 MED ORDER — TRANEXAMIC ACID-NACL 1000-0.7 MG/100ML-% IV SOLN
INTRAVENOUS | Status: AC
  Filled 2021-12-31: qty 100

## 2021-12-31 MED ORDER — TRANEXAMIC ACID 1000 MG/10ML IV SOLN
2000.0000 mg | INTRAVENOUS | Status: DC
  Filled 2021-12-31: qty 20

## 2021-12-31 MED ORDER — ASPIRIN 81 MG PO CHEW: 81.0000 mg | CHEWABLE_TABLET | Freq: Two times a day (BID) | ORAL | Status: DC

## 2021-12-31 MED ORDER — PANTOPRAZOLE SODIUM 40 MG PO TBEC: 40.0000 mg | DELAYED_RELEASE_TABLET | Freq: Every day | ORAL | Status: DC

## 2021-12-31 MED ORDER — ASPIRIN 81 MG PO TBEC: 81.0000 mg | DELAYED_RELEASE_TABLET | Freq: Every day | ORAL | 2 refills | Status: DC

## 2021-12-31 MED ORDER — POVIDONE-IODINE 10 % EX SWAB
2.0000 | Freq: Once | CUTANEOUS | Status: AC
  Administered 2021-12-31: 2 via TOPICAL

## 2021-12-31 MED ORDER — ACETAMINOPHEN 500 MG PO TABS: 1000.0000 mg | ORAL_TABLET | Freq: Once | ORAL | Status: AC

## 2021-12-31 MED ORDER — SODIUM CHLORIDE 0.9% FLUSH
INTRAVENOUS | Status: DC | PRN
  Administered 2021-12-31: 50 mL

## 2021-12-31 MED ORDER — ACETAMINOPHEN 325 MG PO TABS: 325.0000 mg | ORAL_TABLET | Freq: Four times a day (QID) | ORAL | Status: DC | PRN

## 2021-12-31 MED ORDER — OXYCODONE HCL 5 MG PO TABS
5.0000 mg | ORAL_TABLET | ORAL | Status: DC | PRN
  Administered 2021-12-31: 5 mg via ORAL

## 2021-12-31 MED ORDER — KCL IN DEXTROSE-NACL 20-5-0.45 MEQ/L-%-% IV SOLN: INTRAVENOUS | Status: DC

## 2021-12-31 MED ORDER — DEXAMETHASONE SODIUM PHOSPHATE 10 MG/ML IJ SOLN
INTRAMUSCULAR | Status: DC | PRN
  Administered 2021-12-31: 10 mg via INTRAVENOUS

## 2021-12-31 MED ORDER — METOCLOPRAMIDE HCL 5 MG PO TABS: 5.0000 mg | ORAL_TABLET | Freq: Three times a day (TID) | ORAL | Status: DC | PRN

## 2021-12-31 MED ORDER — OXYCODONE HCL 5 MG PO TABS
ORAL_TABLET | ORAL | Status: AC
  Filled 2021-12-31: qty 1

## 2021-12-31 MED ORDER — DEXAMETHASONE SODIUM PHOSPHATE 10 MG/ML IJ SOLN: 10.0000 mg | Freq: Once | INTRAMUSCULAR | Status: DC

## 2021-12-31 MED ORDER — PHENOL 1.4 % MT LIQD: 1.0000 | OROMUCOSAL | Status: DC | PRN

## 2021-12-31 MED ORDER — LACTATED RINGERS IV BOLUS
500.0000 mL | Freq: Once | INTRAVENOUS | Status: AC
  Administered 2021-12-31: 500 mL via INTRAVENOUS

## 2021-12-31 MED ORDER — BUPIVACAINE LIPOSOME 1.3 % IJ SUSP: 10.0000 mL | Freq: Once | INTRAMUSCULAR | Status: DC

## 2021-12-31 MED ORDER — CEFAZOLIN IN SODIUM CHLORIDE 3-0.9 GM/100ML-% IV SOLN
3.0000 g | INTRAVENOUS | Status: AC
  Administered 2021-12-31: 3 g via INTRAVENOUS

## 2021-12-31 MED ORDER — HYDROMORPHONE HCL 1 MG/ML IJ SOLN
INTRAMUSCULAR | Status: AC
  Administered 2021-12-31: 0.5 mg via INTRAVENOUS
  Filled 2021-12-31: qty 1

## 2021-12-31 MED ORDER — OXYCODONE HCL 5 MG PO TABS: 5.0000 mg | ORAL_TABLET | Freq: Once | ORAL | Status: DC | PRN

## 2021-12-31 MED ORDER — POLYETHYL GLYCOL-PROPYL GLYCOL 0.4-0.3 % OP GEL: OPHTHALMIC | Status: DC | PRN

## 2021-12-31 MED ORDER — FENTANYL CITRATE (PF) 100 MCG/2ML IJ SOLN
INTRAMUSCULAR | Status: DC | PRN
  Administered 2021-12-31: 25 ug via INTRAVENOUS
  Administered 2021-12-31: 50 ug via INTRAVENOUS
  Administered 2021-12-31: 25 ug via INTRAVENOUS

## 2021-12-31 MED ORDER — MIDAZOLAM HCL 2 MG/2ML IJ SOLN
INTRAMUSCULAR | Status: DC | PRN
  Administered 2021-12-31: 2 mg via INTRAVENOUS

## 2021-12-31 MED ORDER — HYDROMORPHONE HCL 1 MG/ML IJ SOLN: 0.5000 mg | INTRAMUSCULAR | Status: DC | PRN

## 2021-12-31 MED ORDER — CHLORHEXIDINE GLUCONATE 0.12 % MT SOLN
15.0000 mL | Freq: Once | OROMUCOSAL | Status: AC
  Administered 2021-12-31: 15 mL via OROMUCOSAL

## 2021-12-31 MED ORDER — OXYCODONE HCL 5 MG/5ML PO SOLN: 5.0000 mg | Freq: Once | ORAL | Status: DC | PRN

## 2021-12-31 MED ORDER — OXYCODONE HCL 5 MG PO TABS: 5.0000 mg | ORAL_TABLET | ORAL | 0 refills | Status: AC | PRN

## 2021-12-31 MED ORDER — LACTATED RINGERS IV BOLUS
250.0000 mL | Freq: Once | INTRAVENOUS | Status: AC
  Administered 2021-12-31: 250 mL via INTRAVENOUS

## 2021-12-31 MED ORDER — TRANEXAMIC ACID 1000 MG/10ML IV SOLN
INTRAVENOUS | Status: DC | PRN
  Administered 2021-12-31: 2000 mg via TOPICAL

## 2021-12-31 MED ORDER — ORAL CARE MOUTH RINSE: 15.0000 mL | Freq: Once | OROMUCOSAL | Status: AC

## 2021-12-31 MED ORDER — FLEET ENEMA 7-19 GM/118ML RE ENEM: 1.0000 | ENEMA | Freq: Once | RECTAL | Status: DC | PRN

## 2021-12-31 SURGICAL SUPPLY — 42 items
BAG COUNTER SPONGE SURGICOUNT (BAG) ×1 IMPLANT
BAG DECANTER FOR FLEXI CONT (MISCELLANEOUS) ×2 IMPLANT
BAG SPNG CNTER NS LX DISP (BAG) ×1
BLADE SAW SGTL 18X1.27X75 (BLADE) ×1 IMPLANT
COVER PERINEAL POST (MISCELLANEOUS) ×1 IMPLANT
COVER SURGICAL LIGHT HANDLE (MISCELLANEOUS) ×1 IMPLANT
DRAPE STERI IOBAN 125X83 (DRAPES) ×1 IMPLANT
DRAPE U-SHAPE 47X51 STRL (DRAPES) ×2 IMPLANT
DRSG AQUACEL AG ADV 3.5X10 (GAUZE/BANDAGES/DRESSINGS) ×1 IMPLANT
DURAPREP 26ML APPLICATOR (WOUND CARE) ×1 IMPLANT
ELECT BLADE TIP CTD 4 INCH (ELECTRODE) ×1 IMPLANT
ELECT REM PT RETURN 15FT ADLT (MISCELLANEOUS) ×1 IMPLANT
ELIMINATOR HOLE APEX DEPUY (Hips) IMPLANT
GLOVE BIO SURGEON STRL SZ7.5 (GLOVE) ×1 IMPLANT
GLOVE BIO SURGEON STRL SZ8.5 (GLOVE) ×1 IMPLANT
GLOVE BIOGEL PI IND STRL 8 (GLOVE) ×1 IMPLANT
GLOVE BIOGEL PI IND STRL 9 (GLOVE) ×1 IMPLANT
GOWN STRL REUS W/ TWL XL LVL3 (GOWN DISPOSABLE) ×2 IMPLANT
GOWN STRL REUS W/TWL XL LVL3 (GOWN DISPOSABLE) ×2
HEAD CERAMIC DELTA 36 PLUS 1.5 (Hips) IMPLANT
HOLDER FOLEY CATH W/STRAP (MISCELLANEOUS) ×1 IMPLANT
KIT TURNOVER KIT A (KITS) IMPLANT
MANIFOLD NEPTUNE II (INSTRUMENTS) ×1 IMPLANT
NDL HYPO 21X1.5 SAFETY (NEEDLE) ×2 IMPLANT
NEEDLE HYPO 21X1.5 SAFETY (NEEDLE) ×2 IMPLANT
NS IRRIG 1000ML POUR BTL (IV SOLUTION) ×1 IMPLANT
PACK ANTERIOR HIP CUSTOM (KITS) ×1 IMPLANT
PIN SECT CUP 56MM (Hips) IMPLANT
PINNACLE ALTRX PLUS 4 N 36X56 (Hips) IMPLANT
SPIKE FLUID TRANSFER (MISCELLANEOUS) ×1 IMPLANT
STEM FEM ACTIS HIGH SZ7 (Stem) IMPLANT
SUT ETHIBOND NAB CT1 #1 30IN (SUTURE) ×1 IMPLANT
SUT VIC AB 0 CT1 27 (SUTURE)
SUT VIC AB 0 CT1 27XBRD ANBCTR (SUTURE) IMPLANT
SUT VIC AB 1 CTX 36 (SUTURE) ×1
SUT VIC AB 1 CTX36XBRD ANBCTR (SUTURE) ×1 IMPLANT
SUT VIC AB 2-0 CT1 27 (SUTURE) ×1
SUT VIC AB 2-0 CT1 TAPERPNT 27 (SUTURE) ×1 IMPLANT
SUT VIC AB 3-0 CT1 27 (SUTURE) ×1
SUT VIC AB 3-0 CT1 TAPERPNT 27 (SUTURE) ×1 IMPLANT
SYR CONTROL 10ML LL (SYRINGE) ×3 IMPLANT
TRAY FOLEY MTR SLVR 16FR STAT (SET/KITS/TRAYS/PACK) IMPLANT

## 2021-12-31 NOTE — Anesthesia Procedure Notes (Signed)
Procedure Name: MAC Date/Time: 12/31/2021 8:52 AM  Performed by: Renato Shin, CRNAPre-anesthesia Checklist: Patient identified, Emergency Drugs available, Suction available and Patient being monitored Patient Re-evaluated:Patient Re-evaluated prior to induction Oxygen Delivery Method: Simple face mask Preoxygenation: Pre-oxygenation with 100% oxygen Induction Type: IV induction Placement Confirmation: positive ETCO2 and breath sounds checked- equal and bilateral Dental Injury: Teeth and Oropharynx as per pre-operative assessment

## 2021-12-31 NOTE — Discharge Instructions (Addendum)

## 2021-12-31 NOTE — Transfer of Care (Signed)
Immediate Anesthesia Transfer of Care Note  Patient: Brady Gilbert  Procedure(s) Performed: RIGHT TOTAL HIP ARTHROPLASTY ANTERIOR APPROACH (Right: Hip)  Patient Location: PACU  Anesthesia Type:Spinal  Level of Consciousness: drowsy and patient cooperative  Airway & Oxygen Therapy: Patient Spontanous Breathing and Patient connected to face mask oxygen  Post-op Assessment: Report given to RN and Post -op Vital signs reviewed and stable  Post vital signs: Reviewed and stable  Last Vitals:  Vitals Value Taken Time  BP 108/64 12/31/21 1055  Temp    Pulse 75 12/31/21 1058  Resp 22 12/31/21 1058  SpO2 100 % 12/31/21 1058  Vitals shown include unvalidated device data.  Last Pain:  Vitals:   12/31/21 0757  TempSrc: Oral  PainSc:          Complications: No notable events documented.

## 2021-12-31 NOTE — Op Note (Signed)
PATIENT ID:      Brady Gilbert  MRN:     [id number] DOB/AGE:    [birth year] / 61 y.o.  OPERATIVE REPORT   DATE OF PROCEDURE:  12/31/2021      PREOPERATIVE DIAGNOSIS:  RIGH THIP OSTEOARTHRITIS                                                         POSTOPERATIVE DIAGNOSIS:  Same                                                         PROCEDURE: Anterior R total hip arthroplasty using a 56 mm DePuy Pinnacle  Cup, [employer], 0-degree polyethylene liner, a +1.5 mm x 53mm ceramic head, a 7 hi Depuy Actis stem  SURGEON: Nestor Lewandowsky  ASSISTANT:   Tomi Likens. [employer]  (present throughout entire procedure and necessary for timely completion of the procedure)   ANESTHESIA: Spinal, Exparel 133mg  injection BLOOD LOSS: 450 cc FLUID REPLACEMENT: 1600 cc crystalloid TRANEXAMIC ACID: 1gm IV, 2gm Topical COMPLICATIONS: none    INDICATIONS FOR PROCEDURE: A 61 y.o. year-old With  RIGH THIP OSTEOARTHRITIS   for 3 years, x-rays show bone-on-bone arthritic changes, and osteophytes. Despite conservative measures with observation, anti-inflammatory medicine, narcotics, use of a cane, has severe unremitting pain and can ambulate only a few blocks before resting. Patient desires elective R total hip arthroplasty to decrease pain and increase function. The risks, benefits, and alternatives were discussed at length including but not limited to the risks of infection, bleeding, nerve injury, stiffness, blood clots, the need for revision surgery, cardiopulmonary complications, among others, and they were willing to proceed. Questions answered      PROCEDURE IN DETAIL: The patient was identified by armband,   received preoperative IV antibiotics in the holding area at [facility], taken to the operating room , appropriate anesthetic monitors   were attached and anesthesia was induced with the patient on the gurney. HANA boots were applied to the feet, and the patient  was transferred to the HANA  table with a peroneal post and support underneath the non-operative leg. Theoperative lower extremity was then prepped and draped in the usual sterile fashion from just above the iliac crest to the knee. And a timeout procedure was performed. [facility]. Tomi Likens [facility] was present and scrubbed throughout the case, critical for assistance with, positioning, exposure, retraction, instrumentation, and closure.Skin along incision area was injected with 10 cc of Exparel solution. We then made a 15 cm incision along the interval at the leading edge of the tensor fascia lata of starting at 2 cm lateral to the ASIS. Small bleeders in the skin and subcutaneous tissue identified and cauterized we dissected down to the fascia and made an incision in the fascia allowing [facility] to elevate the fascia of the tensor muscle and exploited the interval between the rectus and the tensor fascia lata. A Cobra retractor was then placed along the superior neck of the femur. A cerebellar retractor was used to expose the interval between the tensor fascia lata and the rectus femoris.  We identified  and cauterized the ascending branch of the anterior circumflex artery. A second Cobra retractor along the inferior neck of the femur. A small Hohmann retractor was placed underneath the origin of the rectus femoris, giving [place] good medial exposure. Using Ronguers fatty tissue was removed from in front of the anterior capsule. The capsule was then incised, starting out at the superior anterior rim of the acetabulum going laterally along the anterior neck. The capsule was then teed along the neck superiorly and inferiorly. Electrocautery was used to release capsule from the anterior and medial neck of the femur to allow external rotation. Cobra retractors were then placed along the inferior and superior neck allowing [place] to perform a standard neck cut and removed the femoral head with a power corkscrew. We then placed a medium bent homan retractor in the  cotyloid notch and posteriorly along the acetabular rim a narrow Cobra retractor. Exposed labral tissue and osteophytes were then removed. We then sequentially reamed up to a 55 mm basket reamer obtaining good coverage in all quadrants, verified by C-arm imaging. Under C-arm control we then hammered into place a 56 mm Pinnacle cup in 45 of abduction and 15 of anteversion. The cup seated nicely and required no supplemental screws. We then placed a central hole Eliminator and a 0 polyethylene liner. The foot was then externally rotated to 130-140. The limb was extended and adducted to the floor, delivering the proximal femur up into the wound. A medium curved Hohmann retractor was placed over the greater trochanter and a long Homan retractor along the posterior femoral neck completing the exposure and lateralizing the femur. We then performed releases superiorly and and inferiorly of the capsule going back to the pirformis fossa superiorly and to the lesser trochanter inferiorly. We then entered the proximal femur with the box cutting offset chisel followed by, a canal sounder, the chili pepper and broaching up to a 7 broach. This seated nicely and we reamed the calcar. A trial reduction was performed with a 1.5 mm X 36 mm head.The limb lengths were excellent the hip was stable in 90 of external rotation. At this point the trial components removed and we hammered into place a # 7 hi  Offset Actis stem with Gryption coating. A + 1.5 mm x 36 head was then hammered into place. The hip was reduced and final C-arm images obtained. The wound was thoroughly irrigated with normal saline solution. We repaired the ant capsule and the tensor fascia lot a with running 0 vicryl suture. the subcutaneous tissue was closed with 2-0 and 3-0 Vicryl suture followed by an Aquacil dressing. At this point the patient was awaken and transferred to hospital gurney without difficulty.   Kerin Salen 12/31/2021, 7:36 AM

## 2021-12-31 NOTE — Interval H&P Note (Signed)
History and Physical Interval Note:  12/31/2021 7:36 AM  Brady Gilbert  has presented today for surgery, with the diagnosis of RIGH THIP OSTEOARTHRITIS.  The various methods of treatment have been discussed with the patient and family. After consideration of risks, benefits and other options for treatment, the patient has consented to  Procedure(s): RIGHT TOTAL HIP ARTHROPLASTY ANTERIOR APPROACH (Right) as a surgical intervention.  The patient's history has been reviewed, patient examined, no change in status, stable for surgery.  I have reviewed the patient's chart and labs.  Questions were answered to the patient's satisfaction.     Kerin Salen

## 2021-12-31 NOTE — Evaluation (Signed)
Physical Therapy Evaluation Patient Details Name: Brady Gilbert MRN: [id number] DOB: 26-Sep-1960 Today's Date: 12/31/2021  History of Present Illness  Pt is a 61yo male presenting s/p R-THAAA on 12/31/21. PMH: Gout, HTN  Clinical Impression  Brady Gilbert is a 61 y.o. male POD 0 s/p R-THAAA. Patient reports independence with mobility at baseline. Patient is now limited by functional impairments (see PT problem list below) and requires min guard for transfers and gait with RW. Patient was able to ambulate 80 feet with RW and min guard and cues for safe walker management. Patient educated on safe sequencing for stair mobility and verbalized safe guarding position for people assisting with mobility. Patient instructed in exercises to facilitate ROM and circulation. Patient will benefit from continued skilled PT interventions to address impairments and progress towards PLOF. Patient has met mobility goals at adequate level for discharge home; will continue to follow if pt continues acute stay to progress towards Mod I goals.        Recommendations for follow up therapy are one component of a multi-disciplinary discharge planning process, led by the attending physician.  Recommendations may be updated based on patient status, additional functional criteria and insurance authorization.  Follow Up Recommendations Follow physician's recommendations for discharge plan and follow up therapies      Assistance Recommended at Discharge Set up Supervision/Assistance  Patient can return home with the following  A little help with walking and/or transfers;A little help with bathing/dressing/bathroom;Assistance with cooking/housework;Help with stairs or ramp for entrance;Assist for transportation    Equipment Recommendations Rolling walker (2 wheels)  Recommendations for Other Services       Functional Status Assessment Patient has had a recent decline in their functional status and demonstrates the ability to  make significant improvements in function in a reasonable and predictable amount of time.     Precautions / Restrictions Precautions Precautions: None Restrictions Weight Bearing Restrictions: No Other Position/Activity Restrictions: WBAT      Mobility  Bed Mobility Overal bed mobility: Modified Independent             General bed mobility comments: Increased time    Transfers Overall transfer level: Needs assistance Equipment used: Rolling walker (2 wheels) Transfers: Sit to/from Stand Sit to Stand: Min guard, From elevated surface           General transfer comment: For safety only from stretcher    Ambulation/Gait Ambulation/Gait assistance: Min guard Gait Distance (Feet): 80 Feet Assistive device: Rolling walker (2 wheels) Gait Pattern/deviations: Step-to pattern, Step-through pattern Gait velocity: decreased     General Gait Details: Pt ambulated with RW and min guard, no overt LOB noted or physical assist required. Pt attempted step-through pattern but reported increased sway and mild lightheadedness, directed pt to continue step-to pattern.  Stairs Stairs: Yes Stairs assistance: Min guard Stair Management: Two rails, Step to pattern, Forwards Number of Stairs: 2 General stair comments: Pt educated on safe stair mobility, verbalized understanding. Demonstrated safe mobility with min guard, no overt LOB noted or physical assist required.  Wheelchair Mobility    Modified Rankin (Stroke Patients Only)       Balance                                             Pertinent Vitals/Pain Pain Assessment Pain Assessment: 0-10 Pain Score: 5  Pain Location: right hip  Pain Descriptors / Indicators: Operative site guarding Pain Intervention(s): Limited activity within patient's tolerance, Ice applied, Repositioned, Monitored during session    [place] expects to be discharged to:: Private residence Living  Arrangements: Alone Available Help at Discharge: Family;Available 24 hours/day Type of Home: House Home Access: Stairs to enter Entrance Stairs-Rails: Right;Left;Can reach both Entrance Stairs-Number of Steps: 4   Home Layout: One level Home Equipment: Toilet riser;Grab bars - toilet;Cane - single point      Prior Function Prior Level of Function : Independent/Modified Independent;Driving;Working/employed;History of Falls (last six months) (Teach CPR, dance, and ASL, retired Research officer, trade union)             Mobility Comments: ind ADLs Comments: ind     Hand Dominance   Dominant Hand: Left    Extremity/Trunk Assessment   Upper Extremity Assessment Upper Extremity Assessment: Overall WFL for tasks assessed    Lower Extremity Assessment Lower Extremity Assessment: RLE deficits/detail;LLE deficits/detail RLE Deficits / Details: MMT ank DF/PF 5/5 RLE Sensation: WNL LLE Deficits / Details: MMT ank DF/PF 5/5 LLE Sensation: WNL    Cervical / Trunk Assessment Cervical / Trunk Assessment: Normal  Communication   Communication: No difficulties  Cognition Arousal/Alertness: Awake/alert Behavior During Therapy: WFL for tasks assessed/performed Overall Cognitive Status: Within Functional Limits for tasks assessed                                          General Comments General comments (skin integrity, edema, etc.): VSS, sister Judeen Hammans present    Exercises Total Joint Exercises Ankle Circles/Pumps: AROM, Both, 10 reps Quad Sets: AROM, Right, Other reps (comment) (3) Short Arc Quad: AROM, Right, Other reps (comment) (2) Heel Slides: AROM, Right, Other reps (comment) (2) Hip ABduction/ADduction: AROM, Right, Other reps (comment) (3)   Assessment/Plan    PT Assessment Patient needs continued PT services  PT Problem List Decreased strength;Decreased range of motion;Decreased activity tolerance;Decreased balance;Decreased mobility;Decreased  coordination;Pain       PT Treatment Interventions DME instruction;Gait training;Stair training;Functional mobility training;Therapeutic activities;Therapeutic exercise;Balance training;Neuromuscular re-education;Patient/family education    PT Goals (Current goals can be found in the Care Plan section)  Acute Rehab PT Goals 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 PT Goal Formulation: With patient Time For Goal Achievement: 01/07/22 Potential to Achieve Goals: Good    Frequency 7X/week     Co-evaluation               AM-PAC PT "6 Clicks" Mobility  Outcome Measure Help needed turning from your back to your side while in a flat bed without using bedrails?: None Help needed moving from lying on your back to sitting on the side of a flat bed without using bedrails?: A Little Help needed moving to and from a bed to a chair (including a wheelchair)?: A Little Help needed standing up from a chair using your arms (e.g., wheelchair or bedside chair)?: A Little Help needed to walk in hospital room?: A Little Help needed climbing 3-5 steps with a railing? : A Little 6 Click Score: 19    End of Session Equipment Utilized During Treatment: Gait belt Activity Tolerance: Patient tolerated treatment well;No increased pain Patient left: in chair;with call bell/phone within reach;with family/visitor present Nurse Communication: Mobility status PT Visit Diagnosis: Pain;Difficulty in walking, not elsewhere classified (R26.2) Pain - Right/Left: Right Pain - part of body: Hip  Time: 1225-1254 PT Time Calculation (min) (ACUTE ONLY): 29 min   Charges:   PT Evaluation $PT Eval Low Complexity: 1 Low PT Treatments $Gait Training: 8-22 mins        Coolidge Breeze, PT, DPT [facility] Department Office: [phone]  Coolidge Breeze 12/31/2021, 1:00 PM

## 2021-12-31 NOTE — Anesthesia Postprocedure Evaluation (Signed)
Anesthesia Post Note  Patient: Brady Gilbert  Procedure(s) Performed: RIGHT TOTAL HIP ARTHROPLASTY ANTERIOR APPROACH (Right: Hip)     Patient location during evaluation: PACU Anesthesia Type: Spinal Level of consciousness: awake and alert Pain management: pain level controlled Vital Signs Assessment: post-procedure vital signs reviewed and stable Respiratory status: spontaneous breathing and respiratory function stable Cardiovascular status: blood pressure returned to baseline and stable Postop Assessment: spinal receding and no apparent nausea or vomiting Anesthetic complications: no   No notable events documented.  Last Vitals:  Vitals:   12/31/21 1115 12/31/21 1125  BP: (!) 109/93 105/89  Pulse: 81 78  Resp: 20 18  Temp: (!) 36.4 C   SpO2: 98% 96%    Last Pain:  Vitals:   12/31/21 1125  TempSrc:   PainSc: [place]

## 2021-12-31 NOTE — Progress Notes (Signed)
Orthopedic Tech Progress Note Patient Details:  Brady Gilbert 06-19-1960 [id number]  Patient ID: Brady Gilbert, male   DOB: 1960/05/28, 61 y.o.   MRN: [id number] No OHF, pt is over weight limit. Brady Gilbert 12/31/2021, 10:20 AM

## 2021-12-31 NOTE — Anesthesia Procedure Notes (Signed)
Spinal  Patient location during procedure: OR Start time: 12/31/2021 8:48 AM End time: 12/31/2021 8:50 AM Reason for block: surgical anesthesia Staffing Performed: resident/CRNA  Performed by: Renato Shin, CRNA Authorized by: Audry Pili, MD   Preanesthetic Checklist Completed: patient identified, IV checked, site marked, risks and benefits discussed, surgical consent, monitors and equipment checked, pre-op evaluation and timeout performed Spinal Block Patient position: sitting Prep: DuraPrep Patient monitoring: heart rate, cardiac monitor, continuous pulse ox and blood pressure Approach: midline Location: L3-4 Injection technique: single-shot Needle Needle type: Sprotte  Needle gauge: 24 G Needle length: 9 cm Assessment Sensory level: T4 Events: CSF return

## 2022-01-01 ENCOUNTER — Encounter (HOSPITAL_COMMUNITY): Payer: Self-pay | Admitting: Orthopedic Surgery

## 2022-01-01 DIAGNOSIS — Z471 Aftercare following joint replacement surgery: Secondary | ICD-10-CM | POA: Diagnosis not present

## 2022-01-04 DIAGNOSIS — Z471 Aftercare following joint replacement surgery: Secondary | ICD-10-CM | POA: Diagnosis not present

## 2022-01-05 DIAGNOSIS — Z471 Aftercare following joint replacement surgery: Secondary | ICD-10-CM | POA: Diagnosis not present

## 2022-01-08 DIAGNOSIS — M1990 Unspecified osteoarthritis, unspecified site: Secondary | ICD-10-CM | POA: Diagnosis not present

## 2022-01-08 DIAGNOSIS — G4730 Sleep apnea, unspecified: Secondary | ICD-10-CM | POA: Diagnosis not present

## 2022-01-08 DIAGNOSIS — H02839 Dermatochalasis of unspecified eye, unspecified eyelid: Secondary | ICD-10-CM | POA: Diagnosis not present

## 2022-01-08 DIAGNOSIS — M109 Gout, unspecified: Secondary | ICD-10-CM | POA: Diagnosis not present

## 2022-01-08 DIAGNOSIS — Z471 Aftercare following joint replacement surgery: Secondary | ICD-10-CM | POA: Diagnosis not present

## 2022-01-08 DIAGNOSIS — I10 Essential (primary) hypertension: Secondary | ICD-10-CM | POA: Diagnosis not present

## 2022-01-08 DIAGNOSIS — Z7982 Long term (current) use of aspirin: Secondary | ICD-10-CM | POA: Diagnosis not present

## 2022-01-08 DIAGNOSIS — E663 Overweight: Secondary | ICD-10-CM | POA: Diagnosis not present

## 2022-01-08 DIAGNOSIS — K5790 Diverticulosis of intestine, part unspecified, without perforation or abscess without bleeding: Secondary | ICD-10-CM | POA: Diagnosis not present

## 2022-01-08 DIAGNOSIS — Z96641 Presence of right artificial hip joint: Secondary | ICD-10-CM | POA: Diagnosis not present

## 2022-01-08 DIAGNOSIS — Z720 Tobacco use: Secondary | ICD-10-CM | POA: Diagnosis not present

## 2022-01-08 DIAGNOSIS — H02402 Unspecified ptosis of left eyelid: Secondary | ICD-10-CM | POA: Diagnosis not present

## 2022-01-09 DIAGNOSIS — G4730 Sleep apnea, unspecified: Secondary | ICD-10-CM | POA: Diagnosis not present

## 2022-01-09 DIAGNOSIS — H02839 Dermatochalasis of unspecified eye, unspecified eyelid: Secondary | ICD-10-CM | POA: Diagnosis not present

## 2022-01-09 DIAGNOSIS — M109 Gout, unspecified: Secondary | ICD-10-CM | POA: Diagnosis not present

## 2022-01-09 DIAGNOSIS — E663 Overweight: Secondary | ICD-10-CM | POA: Diagnosis not present

## 2022-01-09 DIAGNOSIS — Z96641 Presence of right artificial hip joint: Secondary | ICD-10-CM | POA: Diagnosis not present

## 2022-01-09 DIAGNOSIS — I10 Essential (primary) hypertension: Secondary | ICD-10-CM | POA: Diagnosis not present

## 2022-01-09 DIAGNOSIS — Z7982 Long term (current) use of aspirin: Secondary | ICD-10-CM | POA: Diagnosis not present

## 2022-01-09 DIAGNOSIS — M1990 Unspecified osteoarthritis, unspecified site: Secondary | ICD-10-CM | POA: Diagnosis not present

## 2022-01-09 DIAGNOSIS — H02402 Unspecified ptosis of left eyelid: Secondary | ICD-10-CM | POA: Diagnosis not present

## 2022-01-09 DIAGNOSIS — Z720 Tobacco use: Secondary | ICD-10-CM | POA: Diagnosis not present

## 2022-01-09 DIAGNOSIS — K5790 Diverticulosis of intestine, part unspecified, without perforation or abscess without bleeding: Secondary | ICD-10-CM | POA: Diagnosis not present

## 2022-01-09 DIAGNOSIS — Z471 Aftercare following joint replacement surgery: Secondary | ICD-10-CM | POA: Diagnosis not present

## 2022-01-10 DIAGNOSIS — Z96641 Presence of right artificial hip joint: Secondary | ICD-10-CM | POA: Diagnosis not present

## 2022-01-10 DIAGNOSIS — Z471 Aftercare following joint replacement surgery: Secondary | ICD-10-CM | POA: Diagnosis not present

## 2022-02-12 DIAGNOSIS — H1032 Unspecified acute conjunctivitis, left eye: Secondary | ICD-10-CM | POA: Diagnosis not present

## 2022-04-02 ENCOUNTER — Encounter: Payer: Self-pay | Admitting: Cardiology

## 2022-04-02 ENCOUNTER — Ambulatory Visit: Payer: 59 | Attending: Cardiovascular Disease | Admitting: Cardiology

## 2022-04-02 VITALS — BP 138/84 | HR 84 | Ht 74.0 in | Wt 287.0 lb

## 2022-04-02 DIAGNOSIS — I10 Essential (primary) hypertension: Secondary | ICD-10-CM

## 2022-04-02 DIAGNOSIS — Z6836 Body mass index (BMI) 36.0-36.9, adult: Secondary | ICD-10-CM | POA: Diagnosis not present

## 2022-04-02 NOTE — Patient Instructions (Signed)
Medication Instructions:  No changes *If you need a refill on your cardiac medications before your next appointment, please call your pharmacy*   Lab Work: None ordered If you have labs (blood work) drawn today and your tests are completely normal, you will receive your results only by: MyChart Message (if you have MyChart) OR A paper copy in the mail If you have any lab test that is abnormal or we need to change your treatment, we will call you to review the results.   Testing/Procedures: None ordered   Follow-Up: At [facility], you and your health needs are our priority.  As part of our continuing mission to provide you with exceptional heart care, we have created designated Provider Care Teams.  These Care Teams include your primary Cardiologist (physician) and Advanced Practice Providers (APPs -  Physician Assistants and Nurse Practitioners) who all work together to provide you with the care you need, when you need it.  We recommend signing up for the patient portal called "MyChart".  Sign up information is provided on this After Visit Summary.  MyChart is used to connect with patients for Virtual Visits (Telemedicine).  Patients are able to view lab/test results, encounter notes, upcoming appointments, etc.  Non-urgent messages can be sent to your provider as well.   To learn more about what you can do with MyChart, go to [email or web address].    Your next appointment:   6 month(s)  The format for your next appointment:   In Person  Provider:   You may see Dr. Azucena Cecil or one of the following Advanced Practice Providers on your designated Care Team:   Nicolasa Ducking, NP Eula Listen, PA-C Cadence Fransico Michael, PA-C Charlsie Quest, NP

## 2022-04-02 NOTE — Progress Notes (Signed)
Cardiology Office Note:    Date:  04/02/2022   ID:  [name], DOB 02-18-1961, MRN [id number]  PCP:  Wells Guiles   [facility] Providers Cardiologist:  Debbe Odea, MD     Referring MD: Dalia Heading, MD   Chief Complaint  Patient presents with   New Patient (Initial Visit)    HTN, no cardiac concerns     History of Present Illness:    Brady Gilbert is a 61 y.o. male with a hx of hypertension, OSA on CPAP who presents to establish care.  Previously seen by case cardiology from a cardiac perspective.  Has a history of palpitations, Holter revealed sinus rhythm with PACs and PVCs, no sustained arrhythmias or pauses.  Initially had daytime fatigue, underwent sleep study revealing OSA.  Has felt well since using CPAP.  Denies chest pain or shortness of breath.  Very active playing golf, taking dance classes.  Takes losartan 50 mg daily, blood pressure at home between 120-134 systolic.  Past Medical History:  Diagnosis Date   Arthritis    Diverticulitis    Elevated lipids    Gout    History of kidney stones    Hypertension    Overweight    Sleep apnea     Past Surgical History:  Procedure Laterality Date   APPENDECTOMY  1961   BELPHAROPTOSIS REPAIR  12/2017   COLONOSCOPY     lasix     TOTAL HIP ARTHROPLASTY Right 12/31/2021   Procedure: RIGHT TOTAL HIP ARTHROPLASTY ANTERIOR APPROACH;  Surgeon: Gean Birchwood, MD;  Location: [facility];  Service: Orthopedics;  Laterality: Right;    Current Medications: Current Meds  Medication Sig   losartan (COZAAR) 100 MG tablet Take 50 mg by mouth daily.   Polyethyl Glycol-Propyl Glycol (SYSTANE OP) Place 1 drop into both eyes as needed (dry eyes).     Allergies:   Patient has no known allergies.   Social History   Socioeconomic History   Marital status: Single    Spouse name: Not on file   Number of children: Not on file   Years of education: Not on file   Highest education level: Not on file   Occupational History   Not on file  Tobacco Use   Smoking status: Never   Smokeless tobacco: Former    Types: Associate Professor Use: Never used  Substance and Sexual Activity   Alcohol use: Not Currently    Comment: occasional   Drug use: Never   Sexual activity: Not on file  Other Topics Concern   Not on file  Social History Narrative   Not on file   Social Determinants of Health   Financial Resource Strain: Not on file  Food Insecurity: Not on file  Transportation Needs: Not on file  Physical Activity: Not on file  Stress: Not on file  Social Connections: Not on file     Family History: The patient's family history includes Heart disease in his father.  ROS:   Please see the history of present illness.     All other systems reviewed and are negative.  EKGs/Labs/Other Studies Reviewed:    The following studies were reviewed today:   EKG:  EKG is  ordered today.  The ekg ordered today demonstrates normal sinus rhythm.  Recent Labs: 12/21/2021: Hemoglobin 15.4; Platelets 241 12/31/2021: BUN 17; Creatinine, Ser 1.12; Potassium 4.5; Sodium 140  Recent Lipid Panel    Component Value  Date/Time   CHOL 201 (H) 02/19/2021 0834   TRIG 152 (H) 02/19/2021 0834   HDL 44 02/19/2021 0834   CHOLHDL 4.6 02/19/2021 0834   LDLCALC 130 (H) 02/19/2021 0834     Risk Assessment/Calculations:             Physical Exam:    VS:  BP 138/84 (BP Location: Right Arm)   Pulse 84   Ht 6\' 2"  (1.88 m)   Wt 287 lb (130.2 kg)   SpO2 96%   BMI 36.85 kg/m     Wt Readings from Last 3 Encounters:  04/02/22 287 lb (130.2 kg)  12/21/21 281 lb (127.5 kg)  12/06/21 271 lb (122.9 kg)     GEN:  Well nourished, well developed in no acute distress HEENT: Normal NECK: No JVD; No carotid bruits CARDIAC: RRR, no murmurs, rubs, gallops RESPIRATORY:  Clear to auscultation without rales, wheezing or rhonchi  ABDOMEN: Soft, non-tender, non-distended MUSCULOSKELETAL:  No edema; No  deformity  SKIN: Warm and dry NEUROLOGIC:  Alert and oriented x 3 PSYCHIATRIC:  Normal affect   ASSESSMENT:    1. Primary hypertension   2. BMI 36.0-36.9,adult    PLAN:    In order of problems listed above:  Hypertension, BP elevated today, usually controlled.  Continue losartan 50 mg daily.  Low-salt diet, increase activity, weight loss advised.  Titrate losartan if systolic is persistently over 03-26-1969.  Obtain fasting lipid profile at follow-up visit. Obesity, low-calorie diet, weight loss, increased activity advised.     Follow-up in 6 months  Medication Adjustments/Labs and Tests Ordered: Current medicines are reviewed at length with the patient today.  Concerns regarding medicines are outlined above.  Orders Placed This Encounter  Procedures   EKG 12-Lead   No orders of the defined types were placed in this encounter.   Patient Instructions  Medication Instructions:  No changes *If you need a refill on your cardiac medications before your next appointment, please call your pharmacy*   Lab Work: None ordered If you have labs (blood work) drawn today and your tests are completely normal, you will receive your results only by: MyChart Message (if you have MyChart) OR A paper copy in the mail If you have any lab test that is abnormal or we need to change your treatment, we will call you to review the results.   Testing/Procedures: None ordered   Follow-Up: At [facility], you and your health needs are our priority.  As part of our continuing mission to provide you with exceptional heart care, we have created designated Provider Care Teams.  These Care Teams include your primary Cardiologist (physician) and Advanced Practice Providers (APPs -  Physician Assistants and Nurse Practitioners) who all work together to provide you with the care you need, when you need it.  We recommend signing up for the patient portal called "MyChart".  Sign up information is  provided on this After Visit Summary.  MyChart is used to connect with patients for Virtual Visits (Telemedicine).  Patients are able to view lab/test results, encounter notes, upcoming appointments, etc.  Non-urgent messages can be sent to your provider as well.   To learn more about what you can do with MyChart, go to [facility].    Your next appointment:   6 month(s)  The format for your next appointment:   In Person  Provider:   You may see Dr. [email or web address] or one of the following Advanced Practice Providers on your designated Care Team:  Nicolasa Ducking, NP Eula Listen, PA-C Cadence Fransico Michael, PA-C Charlsie Quest, NP       Signed, Debbe Odea, MD  04/02/2022 3:49 PM    [facility]

## 2022-04-03 ENCOUNTER — Ambulatory Visit: Payer: 59 | Admitting: Cardiovascular Disease

## 2022-07-15 ENCOUNTER — Other Ambulatory Visit: Payer: Self-pay | Admitting: Physician Assistant

## 2022-07-15 ENCOUNTER — Ambulatory Visit: Payer: Self-pay

## 2022-07-15 DIAGNOSIS — Z0000 Encounter for general adult medical examination without abnormal findings: Secondary | ICD-10-CM

## 2022-07-15 LAB — POCT URINALYSIS DIPSTICK
Bilirubin, UA: NEGATIVE
Blood, UA: NEGATIVE
Glucose, UA: NEGATIVE
Ketones, UA: NEGATIVE
Leukocytes, UA: NEGATIVE
Nitrite, UA: NEGATIVE
Protein, UA: NEGATIVE
Spec Grav, UA: 1.015 (ref 1.010–1.025)
Urobilinogen, UA: 0.2 E.U./dL
pH, UA: 5.5 (ref 5.0–8.0)

## 2022-07-16 LAB — CMP12+LP+TP+TSH+6AC+PSA+CBC…
ALT: 20 IU/L (ref 0–44)
AST: 18 IU/L (ref 0–40)
Albumin/Globulin Ratio: 1.7 (ref 1.2–2.2)
Albumin: 4 g/dL (ref 3.9–4.9)
Alkaline Phosphatase: 77 IU/L (ref 44–121)
BUN/Creatinine Ratio: 14 (ref 10–24)
BUN: 16 mg/dL (ref 8–27)
Basophils Absolute: 0.1 10*3/uL (ref 0.0–0.2)
Basos: 1 %
Bilirubin Total: 0.3 mg/dL (ref 0.0–1.2)
Calcium: 9.3 mg/dL (ref 8.6–10.2)
Chloride: 104 mmol/L (ref 96–106)
Chol/HDL Ratio: 4.6 ratio (ref 0.0–5.0)
Cholesterol, Total: 194 mg/dL (ref 100–199)
Creatinine, Ser: 1.13 mg/dL (ref 0.76–1.27)
EOS (ABSOLUTE): 0.3 10*3/uL (ref 0.0–0.4)
Eos: 4 %
Estimated CHD Risk: 0.9 times avg. (ref 0.0–1.0)
Free Thyroxine Index: 1.8 (ref 1.2–4.9)
GGT: 16 IU/L (ref 0–65)
Globulin, Total: 2.3 g/dL (ref 1.5–4.5)
Glucose: 120 mg/dL — ABNORMAL HIGH (ref 70–99)
HDL: 42 mg/dL (ref >39)
Hematocrit: 45.2 % (ref 37.5–51.0)
Hemoglobin: 15.2 g/dL (ref 13.0–17.7)
Immature Grans (Abs): 0 10*3/uL (ref 0.0–0.1)
Immature Granulocytes: 0 %
Iron: 82 ug/dL (ref 38–169)
LDH: 162 IU/L (ref 121–224)
LDL Chol Calc (NIH): 117 mg/dL — ABNORMAL HIGH (ref 0–99)
Lymphocytes Absolute: 2.5 10*3/uL (ref 0.7–3.1)
Lymphs: 40 %
MCH: 29.9 pg (ref 26.6–33.0)
MCHC: 33.6 g/dL (ref 31.5–35.7)
MCV: 89 fL (ref 79–97)
Monocytes Absolute: 0.6 10*3/uL (ref 0.1–0.9)
Monocytes: 9 %
Neutrophils Absolute: 2.9 10*3/uL (ref 1.4–7.0)
Neutrophils: 46 %
Phosphorus: 3.3 mg/dL (ref 2.8–4.1)
Platelets: 217 10*3/uL (ref 150–450)
Potassium: 4.9 mmol/L (ref 3.5–5.2)
Prostate Specific Ag, Serum: 0.5 ng/mL (ref 0.0–4.0)
RBC: 5.09 x10E6/uL (ref 4.14–5.80)
RDW: 13 % (ref 11.6–15.4)
Sodium: 141 mmol/L (ref 134–144)
T3 Uptake Ratio: 26 % (ref 24–39)
T4, Total: 6.8 ug/dL (ref 4.5–12.0)
TSH: 2.89 u[IU]/mL (ref 0.450–4.500)
Total Protein: 6.3 g/dL (ref 6.0–8.5)
Triglycerides: 199 mg/dL — ABNORMAL HIGH (ref 0–149)
Uric Acid: 6.6 mg/dL (ref 3.8–8.4)
VLDL Cholesterol Cal: 35 mg/dL (ref 5–40)
WBC: 6.4 10*3/uL (ref 3.4–10.8)
eGFR: 73 mL/min/{1.73_m2} (ref >59)

## 2022-07-16 LAB — HGB A1C W/O EAG: Hgb A1c MFr Bld: 6.1 % — ABNORMAL HIGH (ref 4.8–5.6)

## 2022-07-18 ENCOUNTER — Ambulatory Visit: Payer: Self-pay | Admitting: Physician Assistant

## 2022-07-18 ENCOUNTER — Encounter: Payer: Self-pay | Admitting: Physician Assistant

## 2022-07-18 VITALS — BP 129/82 | HR 91 | Temp 97.8°F | Resp 12 | Ht 74.0 in | Wt 277.0 lb

## 2022-07-18 DIAGNOSIS — Z0000 Encounter for general adult medical examination without abnormal findings: Secondary | ICD-10-CM

## 2022-07-18 NOTE — Progress Notes (Signed)
[facility]  ____________________________________________   None    (approximate)  I have reviewed the triage vital signs and the nursing notes.   HISTORY  Chief Complaint Annual Exam   HPI Brady Gilbert is a 62 y.o. male patient presents for annual physical exam.  Patient was no concerning complaints.  Patient is status post 6 months right total hip replacement.        ASSOCIATEDSYMPTOMS (pertinent positives and negatives)**} Past Medical History:  Diagnosis Date   Arthritis    Diverticulitis    Elevated lipids    Gout    History of kidney stones    Hypertension    Overweight    Sleep apnea     Patient Active Problem List   Diagnosis Date Noted   Osteoarthritis of right hip 12/28/2021   Primary hypertension 10/31/2021   Chest pain at rest 11/24/2017   Palpitations 11/24/2017   Dermatochalasis of eyelid 11/18/2017   Ptosis, left 11/18/2017   Visual field defect 11/18/2017    Past Surgical History:  Procedure Laterality Date   APPENDECTOMY  1961   BELPHAROPTOSIS REPAIR  12/2017   COLONOSCOPY     lasix     TOTAL HIP ARTHROPLASTY Right 12/31/2021   Procedure: RIGHT TOTAL HIP ARTHROPLASTY ANTERIOR APPROACH;  Surgeon: Gean Birchwoodowan, Frank, MD;  Location: [facility];  Service: Orthopedics;  Laterality: Right;    Prior to Admission medications   Medication Sig Start Date End Date Taking? Authorizing Provider  losartan (COZAAR) 100 MG tablet Take 50 mg by mouth daily. 10/31/21  Yes [provider]  Polyethyl Glycol-Propyl Glycol (SYSTANE OP) Place 1 drop into both eyes as needed (dry eyes).   Yes [provider]    Allergies Patient has no known allergies.  Family History  Problem Relation Age of Onset   Heart disease Father     Social History Social History   Tobacco Use   Smoking status: Never   Smokeless tobacco: Former    Types: Associate ProfessorChew  Vaping Use   Vaping Use: Never used  Substance Use Topics   Alcohol  use: Not Currently    Comment: occasional   Drug use: Never    Review of Systems Constitutional: No fever/chills Eyes: No visual changes. ENT: No sore throat. Cardiovascular: Denies chest pain. Respiratory: Denies shortness of breath. Gastrointestinal: No abdominal pain.  No nausea, no vomiting.  No diarrhea.  No constipation. Genitourinary: Negative for dysuria. Musculoskeletal: Negative for back pain. Skin: Negative for rash. Neurological: Negative for headaches, focal weakness or numbness. Endocrine: Hypertension  ____________________________________________   PHYSICAL EXAM:  VITAL SIGNS: BP is 129/82, pulse 91, respiration 12, temperature 97.8, and patient 96% O2 sat on room air.  Patient weighs 277 pounds and BMI 35.56. Constitutional: Alert and oriented. Well appearing and in no acute distress. Eyes: Conjunctivae are normal. PERRL. EOMI. Head: Atraumatic. Nose: No congestion/rhinnorhea. Mouth/Throat: Mucous membranes are moist.  Oropharynx non-erythematous. Neck: No stridor.  No cervical spine tenderness to palpation. Hematological/Lymphatic/Immunilogical: No cervical lymphadenopathy. Cardiovascular: Normal rate, regular rhythm. Grossly normal heart sounds.  Good peripheral circulation. Respiratory: Normal respiratory effort.  No retractions. Lungs CTAB. Gastrointestinal: Soft and nontender.  Distention secondary to body habitus . No abdominal bruits. No CVA tenderness. Genitourinary: Deferred Musculoskeletal: No lower extremity tenderness nor edema.  No joint effusions. Neurologic:  Normal speech and language. No gross focal neurologic deficits are appreciated. No gait instability. Skin:  Skin is warm, dry and intact. No rash noted. Psychiatric: Mood  and affect are normal. Speech and behavior are normal.  ____________________________________________   LABS        Component Ref Range & Units 3 d ago 1 yr ago 2 yr ago 3 yr ago  Color, UA Dark Yellow Yellow yellow  Light Yellow  Clarity, UA Clear Clear clear Clear  Glucose, UA Negative Negative Negative Negative Negative  Bilirubin, UA Negative Negative negative Negative  Ketones, UA Negative Negative negative Negative  Spec Grav, UA 1.010 - 1.025 1.015 1.015 1.025 1.010  Blood, UA Negative Negative negative Negative  pH, UA 5.0 - 8.0 5.5 6.0 5.5 6.0  Protein, UA Negative Negative Negative Negative Negative  Urobilinogen, UA 0.2 or 1.0 E.U./dL 0.2 0.2 0.2 0.2  Nitrite, UA Negative Negative negative Negative  Leukocytes, UA Negative Negative Negative Negative Negative  Appearance   medium   Odor         0 Result Notes            Component Ref Range & Units 3 d ago (07/15/22) 6 mo ago (12/31/21) 6 mo ago (12/21/21) 1 yr ago (02/19/21) 2 yr ago (02/25/20) 3 yr ago (02/18/19) 4 yr ago (10/20/17)  Glucose 70 - 99 mg/dL 425 High  956 High  CM  103 High  107 High  R 100 High  R   Uric Acid 3.8 - 8.4 mg/dL 6.6   6.7 CM 6.4 CM 6.8 R, CM   Comment:            Therapeutic target for gout patients: <6.0  BUN 8 - 27 mg/dL 16 17 R  13 14 16  R   Creatinine, Ser 0.76 - 1.27 mg/dL 3.87 5.64 R  3.32 9.51 1.26   eGFR >59 mL/min/1.73 73   69     BUN/Creatinine Ratio 10 - 24 14   11 13 13  R   Sodium 134 - 144 mmol/L 141 140 R  137 144 140   Potassium 3.5 - 5.2 mmol/L 4.9 4.5 R  4.5 5.6 High  4.5   Chloride 96 - 106 mmol/L 104 108 R  100 104 105   Calcium 8.6 - 10.2 mg/dL 9.3 8.9 R  9.6 9.5 9.2 R   Phosphorus 2.8 - 4.1 mg/dL 3.3   2.6 Low  2.6 Low  2.8   Total Protein 6.0 - 8.5 g/dL 6.3   6.6 6.5 6.5   Albumin 3.9 - 4.9 g/dL 4.0   4.3 R 4.3 R 4.0 R   Globulin, Total 1.5 - 4.5 g/dL 2.3   2.3 2.2 2.5   Albumin/Globulin Ratio 1.2 - 2.2 1.7   1.9 2.0 1.6   Bilirubin Total 0.0 - 1.2 mg/dL 0.3   0.7 0.4 0.4   Alkaline Phosphatase 44 - 121 IU/L 77   77 71 CM 75 R   LDH 121 - 224 IU/L 162   165 157 152   AST 0 - 40 IU/L 18   13 15 15    ALT 0 - 44 IU/L 20   13 16 17    GGT 0 - 65 IU/L 16    13 14 16    Iron 38 - 169 ug/dL 82   95 73 83   Cholesterol, Total 100 - 199 mg/dL 884   166 High  063 High  196   Triglycerides 0 - 149 mg/dL 016 High    010 High  932 High  123   HDL >39 mg/dL 42   44 41 40   VLDL Cholesterol  Cal 5 - 40 mg/dL 35   27 30 22    LDL Chol Calc (NIH) 0 - 99 mg/dL 143 High    888 High  757 High  134 High    Chol/HDL Ratio 0.0 - 5.0 ratio 4.6   4.6 CM 5.1 High  CM 4.9 CM   Comment:                                   T. Chol/HDL Ratio                                             Men  Women                               1/2 Avg.Risk  3.4    3.3                                   Avg.Risk  5.0    4.4                                2X Avg.Risk  9.6    7.1                                3X Avg.Risk 23.4   11.0  Estimated CHD Risk 0.0 - 1.0 times avg. 0.9   0.9 CM 1.1 High  CM 1.0 CM   Comment: The CHD Risk is based on the T. Chol/HDL ratio. Other factors affect CHD Risk such as hypertension, smoking, diabetes, severe obesity, and family history of premature CHD.  TSH 0.450 - 4.500 uIU/mL 2.890   2.190 2.100 2.340 4.095 R, CM  T4, Total 4.5 - 12.0 ug/dL 6.8   7.8 5.9 7.2   T3 Uptake Ratio 24 - 39 % 26   31 23  Low  27   Free Thyroxine Index 1.2 - 4.9 1.8   2.4 1.4 1.9   Prostate Specific Ag, Serum 0.0 - 4.0 ng/mL 0.5   0.6 CM 0.6 CM 0.5 CM   Comment: Roche ECLIA methodology. According to the American Urological Association, Serum PSA should decrease and remain at undetectable levels after radical prostatectomy. The AUA defines biochemical recurrence as an initial PSA value 0.2 ng/mL or greater followed by a subsequent confirmatory PSA value 0.2 ng/mL or greater. Values obtained with different assay methods or kits cannot be used interchangeably. Results cannot be interpreted as absolute evidence of the presence or absence of malignant disease.  WBC 3.4 - 10.8 x10E3/uL 6.4  6.7 R 7.5 7.0 6.8   RBC 4.14 - 5.80 x10E6/uL 5.09  4.99 R 5.26 5.29 5.38    Hemoglobin 13.0 - 17.7 g/dL 97.2  82.0 R 60.1 56.1 16.2   Hematocrit 37.5 - 51.0 % 45.2  45.7 R 46.3 47.3 47.2   MCV 79 - 97 fL 89  91.6 R 88 89 88   MCH 26.6 - 33.0 pg 29.9  30.9 R 31.2 30.8 30.1   MCHC 31.5 - 35.7 g/dL 53.7  94.3 R 27.6 14.7 34.3  RDW 11.6 - 15.4 % 13.0  12.5 R 12.2 12.1 13.0   Platelets 150 - 450 x10E3/uL 217  241 R 221 235 246   Neutrophils Not Estab. % 46   59 55 49   Lymphs Not Estab. % 40   31 35 39   Monocytes Not Estab. % 9   7 7 9    Eos Not Estab. % 4   2 2 2    Basos Not Estab. % 1   1 1 1    Neutrophils Absolute 1.4 - 7.0 x10E3/uL 2.9   4.5 3.8 3.3   Lymphocytes Absolute 0.7 - 3.1 x10E3/uL 2.5   2.3 2.5 2.7   Monocytes Absolute 0.1 - 0.9 x10E3/uL 0.6   0.6 0.5 0.6   EOS (ABSOLUTE) 0.0 - 0.4 x10E3/uL 0.3   0.1 0.2 0.2   Basophils Absolute 0.0 - 0.2 x10E3/uL 0.1   0.1 0.1 0.1   Immature Granulocytes Not Estab. % 0   0 0 0   Immature Grans (Abs)             ____________________________________________  EKG  Sinus rhythm at 70 bpm ____________________________________________    ____________________________________________   INITIAL IMPRESSION / ASSESSMENT AND PLAN As part of my medical decision making, I reviewed the following data within the electronic MEDICAL RECORD NUMBER         No acute findings on physical exam and labs.     ____________________________________________   FINAL CLINICAL IMPRESSION    ED Discharge Orders     None        Note:  This document was prepared using Dragon voice recognition software and may include unintentional dictation errors.

## 2022-07-18 NOTE — Progress Notes (Signed)
Pt presents today to complete annual physical. Pt denies any issues or concerns at this time.  

## 2022-10-03 ENCOUNTER — Ambulatory Visit: Payer: 59 | Admitting: Cardiology

## 2022-11-08 ENCOUNTER — Other Ambulatory Visit: Payer: Self-pay | Admitting: Physician Assistant

## 2022-11-08 DIAGNOSIS — I10 Essential (primary) hypertension: Secondary | ICD-10-CM

## 2022-11-27 ENCOUNTER — Encounter: Payer: Self-pay | Admitting: Cardiology

## 2022-11-27 ENCOUNTER — Ambulatory Visit: Payer: 59 | Attending: Cardiology | Admitting: Cardiology

## 2022-11-27 VITALS — BP 142/84 | HR 86 | Ht 74.0 in | Wt 285.8 lb

## 2022-11-27 DIAGNOSIS — Z6836 Body mass index (BMI) 36.0-36.9, adult: Secondary | ICD-10-CM

## 2022-11-27 DIAGNOSIS — I10 Essential (primary) hypertension: Secondary | ICD-10-CM

## 2022-11-27 NOTE — Progress Notes (Signed)
Cardiology Office Note:    Date:  11/27/2022   ID:  LEAMON WENNBERG, DOB 1961-03-23, MRN [id number]  PCP:  Wells Guiles   [facility] Providers Cardiologist:  Debbe Odea, MD     Referring MD: Joni Reining, PA-C   Chief Complaint  Patient presents with   Follow-up    Patient denies new or acute cardiac problems/concerns today.      History of Present Illness:    Brady Gilbert is a 62 y.o. male with a hx of hypertension, OSA on CPAP who presents for follow-up.  Being seen for hypertension.  Endorsed taking medications as prescribed, BP well-controlled at home, states being very active, does golf frequently, denies chest pain or shortness of breath.  Compliant with his CPAP mask as prescribed.  Working on eating healthier and losing weight.  Feels well, no concerns today.  Past Medical History:  Diagnosis Date   Arthritis    Diverticulitis    Elevated lipids    Gout    History of kidney stones    Hypertension    Overweight    Sleep apnea     Past Surgical History:  Procedure Laterality Date   APPENDECTOMY  1961   BELPHAROPTOSIS REPAIR  12/2017   COLONOSCOPY     lasix     TOTAL HIP ARTHROPLASTY Right 12/31/2021   Procedure: RIGHT TOTAL HIP ARTHROPLASTY ANTERIOR APPROACH;  Surgeon: Gean Birchwood, MD;  Location: [facility];  Service: Orthopedics;  Laterality: Right;    Current Medications: Current Meds  Medication Sig   losartan (COZAAR) 100 MG tablet Take 50 mg by mouth daily.   Polyethyl Glycol-Propyl Glycol (SYSTANE OP) Place 1 drop into both eyes as needed (dry eyes).     Allergies:   Patient has no known allergies.   Social History   Socioeconomic History   Marital status: Single    Spouse name: Not on file   Number of children: Not on file   Years of education: Not on file   Highest education level: Not on file  Occupational History   Not on file  Tobacco Use   Smoking status: Never   Smokeless tobacco: Former    Types: Musician Use   Vaping status: Never Used  Substance and Sexual Activity   Alcohol use: Not Currently    Comment: occasional   Drug use: Never   Sexual activity: Not on file  Other Topics Concern   Not on file  Social History Narrative   Not on file   Social Determinants of Health   Financial Resource Strain: Not on file  Food Insecurity: Not on file  Transportation Needs: Not on file  Physical Activity: Not on file  Stress: Not on file  Social Connections: Not on file     Family History: The patient's family history includes Heart disease in his father.  ROS:   Please see the history of present illness.     All other systems reviewed and are negative.  EKGs/Labs/Other Studies Reviewed:    The following studies were reviewed today:   EKG:  EKG not ordered today.   Recent Labs: 07/15/2022: ALT 20; BUN 16; Creatinine, Ser 1.13; Hemoglobin 15.2; Platelets 217; Potassium 4.9; Sodium 141; TSH 2.890  Recent Lipid Panel    Component Value Date/Time   CHOL 194 07/15/2022 0820   TRIG 199 (H) 07/15/2022 0820   HDL 42 07/15/2022 0820   CHOLHDL 4.6 07/15/2022 0820   LDLCALC  117 (H) 07/15/2022 0820     Risk Assessment/Calculations:        Physical Exam:    VS:  BP (!) 142/84 (BP Location: Left Arm, Patient Position: Sitting, Cuff Size: Large)   Pulse 86   Ht 6\' 2"  (1.88 m)   Wt 285 lb 12.8 oz (129.6 kg)   SpO2 96%   BMI 36.69 kg/m     Wt Readings from Last 3 Encounters:  11/27/22 285 lb 12.8 oz (129.6 kg)  07/18/22 277 lb (125.6 kg)  04/02/22 287 lb (130.2 kg)     GEN:  Well nourished, well developed in no acute distress HEENT: Normal NECK: No JVD; No carotid bruits CARDIAC: RRR, no murmurs, rubs, gallops RESPIRATORY:  Clear to auscultation without rales, wheezing or rhonchi  ABDOMEN: Soft, non-tender, non-distended MUSCULOSKELETAL:  No edema; No deformity  SKIN: Warm and dry NEUROLOGIC:  Alert and oriented x 3 PSYCHIATRIC:  Normal affect   ASSESSMENT:     1. Primary hypertension   2. BMI 36.0-36.9,adult    PLAN:    In order of problems listed above:  Hypertension, BP elevated today, usually controlled.  Continue losartan 50 mg daily.  Obesity, low-calorie diet, weight loss advised.    Follow-up as needed.  Medication Adjustments/Labs and Tests Ordered: Current medicines are reviewed at length with the patient today.  Concerns regarding medicines are outlined above.  No orders of the defined types were placed in this encounter.  No orders of the defined types were placed in this encounter.   Patient Instructions  Medication Instructions:   Your physician recommends that you continue on your current medications as directed. Please refer to the Current Medication list given to you today.  *If you need a refill on your cardiac medications before your next appointment, please call your pharmacy*   Lab Work:  None Ordered  If you have labs (blood work) drawn today and your tests are completely normal, you will receive your results only by: MyChart Message (if you have MyChart) OR A paper copy in the mail If you have any lab test that is abnormal or we need to change your treatment, we will call you to review the results.   Testing/Procedures:  None Ordered   Follow-Up: At [facility], you and your health needs are our priority.  As part of our continuing mission to provide you with exceptional heart care, we have created designated Provider Care Teams.  These Care Teams include your primary Cardiologist (physician) and Advanced Practice Providers (APPs -  Physician Assistants and Nurse Practitioners) who all work together to provide you with the care you need, when you need it.  We recommend signing up for the patient portal called "MyChart".  Sign up information is provided on this After Visit Summary.  MyChart is used to connect with patients for Virtual Visits (Telemedicine).  Patients are able to view lab/test  results, encounter notes, upcoming appointments, etc.  Non-urgent messages can be sent to your provider as well.   To learn more about what you can do with MyChart, go to [email or web address].    Your next appointment:    AS NEEDED   Signed, Debbe Odea, MD  11/27/2022 11:21 AM    [facility]

## 2022-11-27 NOTE — Patient Instructions (Signed)
Medication Instructions:   Your physician recommends that you continue on your current medications as directed. Please refer to the Current Medication list given to you today.  *If you need a refill on your cardiac medications before your next appointment, please call your pharmacy*   Lab Work:  None Ordered  If you have labs (blood work) drawn today and your tests are completely normal, you will receive your results only by: MyChart Message (if you have MyChart) OR A paper copy in the mail If you have any lab test that is abnormal or we need to change your treatment, we will call you to review the results.   Testing/Procedures:  None Ordered   Follow-Up: At [facility], you and your health needs are our priority.  As part of our continuing mission to provide you with exceptional heart care, we have created designated Provider Care Teams.  These Care Teams include your primary Cardiologist (physician) and Advanced Practice Providers (APPs -  Physician Assistants and Nurse Practitioners) who all work together to provide you with the care you need, when you need it.  We recommend signing up for the patient portal called "MyChart".  Sign up information is provided on this After Visit Summary.  MyChart is used to connect with patients for Virtual Visits (Telemedicine).  Patients are able to view lab/test results, encounter notes, upcoming appointments, etc.  Non-urgent messages can be sent to your provider as well.   To learn more about what you can do with MyChart, go to [email or web address].    Your next appointment:  AS NEEDED 

## 2023-02-13 ENCOUNTER — Other Ambulatory Visit: Payer: Self-pay

## 2023-02-13 DIAGNOSIS — I10 Essential (primary) hypertension: Secondary | ICD-10-CM

## 2023-02-13 MED ORDER — LOSARTAN POTASSIUM 100 MG PO TABS: 50.0000 mg | ORAL_TABLET | Freq: Every day | ORAL | 3 refills | Status: AC

## 2023-02-20 DIAGNOSIS — H04123 Dry eye syndrome of bilateral lacrimal glands: Secondary | ICD-10-CM | POA: Diagnosis not present

## 2023-02-20 DIAGNOSIS — H5203 Hypermetropia, bilateral: Secondary | ICD-10-CM | POA: Diagnosis not present

## 2023-06-24 ENCOUNTER — Ambulatory Visit: Payer: Self-pay

## 2023-06-24 DIAGNOSIS — Z0000 Encounter for general adult medical examination without abnormal findings: Secondary | ICD-10-CM

## 2023-06-24 LAB — POCT URINALYSIS DIPSTICK
Bilirubin, UA: NEGATIVE
Blood, UA: NEGATIVE
Glucose, UA: NEGATIVE
Ketones, UA: NEGATIVE
Leukocytes, UA: NEGATIVE
Nitrite, UA: NEGATIVE
Protein, UA: NEGATIVE
Spec Grav, UA: <=1.005 — AB (ref 1.010–1.025)
Urobilinogen, UA: 0.2 U/dL
pH, UA: 6 (ref 5.0–8.0)

## 2023-06-24 NOTE — Progress Notes (Signed)
 Pt presents today to complete physical, EKG, LABS and UA.

## 2023-06-25 LAB — CMP12+LP+TP+TSH+6AC+PSA+CBC…
ALT: 17 IU/L (ref 0–44)
AST: 15 IU/L (ref 0–40)
Albumin: 4.1 g/dL (ref 3.9–4.9)
Alkaline Phosphatase: 73 IU/L (ref 44–121)
BUN/Creatinine Ratio: 17 (ref 10–24)
BUN: 19 mg/dL (ref 8–27)
Basophils Absolute: 0.1 10*3/uL (ref 0.0–0.2)
Basos: 1 %
Bilirubin Total: 0.6 mg/dL (ref 0.0–1.2)
Calcium: 9.6 mg/dL (ref 8.6–10.2)
Chloride: 102 mmol/L (ref 96–106)
Chol/HDL Ratio: 5.3 ratio — ABNORMAL HIGH (ref 0.0–5.0)
Cholesterol, Total: 223 mg/dL — ABNORMAL HIGH (ref 100–199)
Creatinine, Ser: 1.11 mg/dL (ref 0.76–1.27)
EOS (ABSOLUTE): 0.2 10*3/uL (ref 0.0–0.4)
Eos: 3 %
Estimated CHD Risk: 1.1 times avg. — ABNORMAL HIGH (ref 0.0–1.0)
Free Thyroxine Index: 1.9 (ref 1.2–4.9)
GGT: 16 IU/L (ref 0–65)
Globulin, Total: 2.8 g/dL (ref 1.5–4.5)
Glucose: 106 mg/dL — ABNORMAL HIGH (ref 70–99)
HDL: 42 mg/dL (ref >39)
Hematocrit: 49.9 % (ref 37.5–51.0)
Hemoglobin: 16.8 g/dL (ref 13.0–17.7)
Immature Grans (Abs): 0 10*3/uL (ref 0.0–0.1)
Immature Granulocytes: 0 %
Iron: 118 ug/dL (ref 38–169)
LDH: 188 IU/L (ref 121–224)
LDL Chol Calc (NIH): 138 mg/dL — ABNORMAL HIGH (ref 0–99)
Lymphocytes Absolute: 2.9 10*3/uL (ref 0.7–3.1)
Lymphs: 39 %
MCH: 30.2 pg (ref 26.6–33.0)
MCHC: 33.7 g/dL (ref 31.5–35.7)
MCV: 90 fL (ref 79–97)
Monocytes Absolute: 0.7 10*3/uL (ref 0.1–0.9)
Monocytes: 9 %
Neutrophils Absolute: 3.5 10*3/uL (ref 1.4–7.0)
Neutrophils: 48 %
Phosphorus: 2.7 mg/dL — ABNORMAL LOW (ref 2.8–4.1)
Platelets: 235 10*3/uL (ref 150–450)
Potassium: 5.3 mmol/L — ABNORMAL HIGH (ref 3.5–5.2)
Prostate Specific Ag, Serum: 0.5 ng/mL (ref 0.0–4.0)
RBC: 5.57 x10E6/uL (ref 4.14–5.80)
RDW: 12.6 % (ref 11.6–15.4)
Sodium: 140 mmol/L (ref 134–144)
T3 Uptake Ratio: 28 % (ref 24–39)
T4, Total: 6.9 ug/dL (ref 4.5–12.0)
TSH: 3.16 u[IU]/mL (ref 0.450–4.500)
Total Protein: 6.9 g/dL (ref 6.0–8.5)
Triglycerides: 238 mg/dL — ABNORMAL HIGH (ref 0–149)
Uric Acid: 6.6 mg/dL (ref 3.8–8.4)
VLDL Cholesterol Cal: 43 mg/dL — ABNORMAL HIGH (ref 5–40)
WBC: 7.3 10*3/uL (ref 3.4–10.8)
eGFR: 74 mL/min/{1.73_m2} (ref >59)

## 2023-06-30 ENCOUNTER — Encounter: Payer: Self-pay | Admitting: Physician Assistant

## 2023-06-30 ENCOUNTER — Ambulatory Visit: Payer: Self-pay | Admitting: Physician Assistant

## 2023-06-30 VITALS — BP 138/83 | HR 82 | Temp 97.3°F | Resp 16 | Wt 285.0 lb

## 2023-06-30 DIAGNOSIS — Z0000 Encounter for general adult medical examination without abnormal findings: Secondary | ICD-10-CM

## 2023-06-30 NOTE — Progress Notes (Signed)
 Here for annual physical and will be his last year due to turning 63 in November.  No complaints voiced.

## 2023-06-30 NOTE — Progress Notes (Signed)
 [facility] [place] [facility]  ____________________________________________   None    (approximate)  I have reviewed the triage vital signs and the nursing notes.   HISTORY  Chief Complaint No chief complaint on file.   HPI Brady Gilbert is a 63 y.o. male patient presents for annual physical exam.  Patient was no concerning complaints.         Past Medical History:  Diagnosis Date   Arthritis    Diverticulitis    Elevated lipids    Gout    History of kidney stones    Hypertension    Overweight    Sleep apnea     Patient Active Problem List   Diagnosis Date Noted   Osteoarthritis of right hip 12/28/2021   Primary hypertension 10/31/2021   Chest pain at rest 11/24/2017   Palpitations 11/24/2017   Dermatochalasis of eyelid 11/18/2017   Ptosis, left 11/18/2017   Visual field defect 11/18/2017    Past Surgical History:  Procedure Laterality Date   APPENDECTOMY  1961   BELPHAROPTOSIS REPAIR  12/2017   COLONOSCOPY     lasix     TOTAL HIP ARTHROPLASTY Right 12/31/2021   Procedure: RIGHT TOTAL HIP ARTHROPLASTY ANTERIOR APPROACH;  Surgeon: Gean Birchwood, MD;  Location: [facility];  Service: Orthopedics;  Laterality: Right;    Prior to Admission medications   Medication Sig Start Date End Date Taking? Authorizing Provider  losartan (COZAAR) 100 MG tablet Take 0.5 tablets (50 mg total) by mouth daily. 02/13/23  Yes Joni Reining, PA-C  Polyethyl Glycol-Propyl Glycol (SYSTANE OP) Place 1 drop into both eyes as needed (dry eyes).   Yes [provider]    Allergies Patient has no known allergies.  Family History  Problem Relation Age of Onset   Heart disease Father     Social History Social History   Tobacco Use   Smoking status: Never   Smokeless tobacco: Former    Types: Associate Professor status: Never Used  Substance Use Topics   Alcohol use: Not Currently    Comment: occasional   Drug use: Never    Review of  Systems Constitutional: No fever/chills Eyes: No visual changes. ENT: No sore throat. Cardiovascular: Denies chest pain. Respiratory: Denies shortness of breath. Gastrointestinal: No abdominal pain.  No nausea, no vomiting.  No diarrhea.  No constipation. Genitourinary: Negative for dysuria. Musculoskeletal: Negative for back pain. Skin: Negative for rash. Neurological: Negative for headaches, focal weakness or numbness.  ____________________________________________   PHYSICAL EXAM:  VITAL SIGNS: BMI: 36.59 kg/m2  BSA: 2.60 m2   BP 138/83  Pulse Rate 82  Temp 97.3 F (36.3 C)Temp. 97.3 F (36.3 C). Data is abnormal. Taken on 06/30/23 9:12 AM  Weight 285 lb (129.3 kg)  Resp 16  SpO2 96 %   Constitutional: Alert and oriented. Well appearing and in no acute distress. Eyes: Conjunctivae are normal. PERRL. EOMI. Head: Atraumatic. Nose: No congestion/rhinnorhea. Mouth/Throat: Mucous membranes are moist.  Oropharynx non-erythematous. Neck: No stridor.  No cervical spine tenderness to palpation. Hematological/Lymphatic/Immunilogical: No cervical lymphadenopathy. Cardiovascular: Normal rate, regular rhythm. Grossly normal heart sounds.  Good peripheral circulation. Respiratory: Normal respiratory effort.  No retractions. Lungs CTAB. Gastrointestinal: Soft and nontender. No distention. No abdominal bruits. No CVA tenderness. Genitourinary: Deferred Musculoskeletal: No lower extremity tenderness nor edema.  No joint effusions. Neurologic:  Normal speech and language. No gross focal neurologic deficits are appreciated. No gait instability. Skin:  Skin is warm,  dry and intact. No rash noted. Psychiatric: Mood and affect are normal. Speech and behavior are normal.  ____________________________________________   LABS         Component Ref Range & Units (hover) 6 d ago 11 mo ago 2 yr ago 3 yr ago 4 yr ago  Color, UA yellow Dark Yellow Yellow yellow Light Yellow  Clarity, UA clear  Clear Clear clear Clear  Glucose, UA Negative Negative Negative Negative Negative  Bilirubin, UA neg Negative Negative negative Negative  Ketones, UA neg Negative Negative negative Negative  Spec Grav, UA <=1.005 Abnormal  1.015 1.015 1.025 1.010  Blood, UA neg Negative Negative negative Negative  pH, UA 6.0 5.5 6.0 5.5 6.0  Protein, UA Negative Negative Negative Negative Negative  Urobilinogen, UA 0.2 0.2 0.2 0.2 0.2  Nitrite, UA neg Negative Negative negative Negative  Leukocytes, UA Negative Negative Negative Negative Negative  Appearance    medium   Odor             Specimen Collected: 06/24/23 09:49 Last Resulted: 06/24/23 09:49      Lab Flowsheet      Order Details      View Encounter      Lab and Collection Details      Routing      Result History    View All Conversations on this Encounter    Result Care Coordination   Patient Communication   Add Comments   Add Notifications  Back to Top   Other Results from 06/24/2023   Contains abnormal data CMP12+LP+TP+TSH+6AC+PSA+CBC. Order: [id number]  Status: Final result     Next appt: None     Dx: Routine adult health maintenance     Test Result Released: Yes (not seen)   0 Result Notes           Component Ref Range & Units (hover) 6 d ago (06/24/23) 11 mo ago (07/15/22) 1 yr ago (12/31/21) 1 yr ago (12/21/21) 2 yr ago (02/19/21) 3 yr ago (02/25/20) 4 yr ago (02/18/19)  Glucose 106 High  120 High  114 High  CM  103 High  107 High  R 100 High  R  Uric Acid 6.6 6.6 CM   6.7 CM 6.4 CM 6.8 R, CM  Comment:            Therapeutic target for gout patients: <6.0  BUN 19 16 17  R  13 14 16  R  Creatinine, Ser 1.11 1.13 1.12 R  1.19 1.12 1.26  eGFR 74 73   69    BUN/Creatinine Ratio 17 14   11 13 13  R  Sodium 140 141 140 R  137 144 140  Potassium 5.3 High  4.9 4.5 R  4.5 5.6 High  4.5  Chloride 102 104 108 R  100 104 105  Calcium 9.6 9.3 8.9 R  9.6 9.5 9.2 R  Phosphorus 2.7 Low  3.3   2.6 Low  2.6 Low  2.8  Total  Protein 6.9 6.3   6.6 6.5 6.5  Albumin 4.1 4.0   4.3 R 4.3 R 4.0 R  Globulin, Total 2.8 2.3   2.3 2.2 2.5  Bilirubin Total 0.6 0.3   0.7 0.4 0.4  Alkaline Phosphatase 73 77   77 71 CM 75 R  LDH 188 162   165 157 152  AST 15 18   13 15 15   ALT 17 20   13 16 17   GGT 16 16   13 14  16  Iron 118 82   95 73 83  Cholesterol, Total 223 High  194   201 High  209 High  196  Triglycerides 238 High  199 High    152 High  168 High  123  HDL 42 42   44 41 40  VLDL Cholesterol Cal 43 High  35   27 30 22   LDL Chol Calc (NIH) 138 High  117 High    130 High  138 High  134 High   Chol/HDL Ratio 5.3 High  4.6 CM   4.6 CM 5.1 High  CM 4.9 CM  Comment:                                   T. Chol/HDL Ratio                                             Men  Women                               1/2 Avg.Risk  3.4    3.3                                   Avg.Risk  5.0    4.4                                2X Avg.Risk  9.6    7.1                                3X Avg.Risk 23.4   11.0  Estimated CHD Risk 1.1 High  0.9 CM   0.9 CM 1.1 High  CM 1.0 CM  Comment: The CHD Risk is based on the T. Chol/HDL ratio. Other factors affect CHD Risk such as hypertension, smoking, diabetes, severe obesity, and family history of premature CHD.  TSH 3.160 2.890   2.190 2.100 2.340  T4, Total 6.9 6.8   7.8 5.9 7.2  T3 Uptake Ratio 28 26   31 23  Low  27  Free Thyroxine Index 1.9 1.8   2.4 1.4 1.9  Prostate Specific Ag, Serum 0.5 0.5 CM   0.6 CM 0.6 CM 0.5 CM  Comment: Roche ECLIA methodology. According to the American Urological Association, Serum PSA should decrease and remain at undetectable levels after radical prostatectomy. The AUA defines biochemical recurrence as an initial PSA value 0.2 ng/mL or greater followed by a subsequent confirmatory PSA value 0.2 ng/mL or greater. Values obtained with different assay methods or kits cannot be used interchangeably. Results cannot be interpreted as absolute evidence of the presence  or absence of malignant disease.  WBC 7.3 6.4  6.7 R 7.5 7.0 6.8  RBC 5.57 5.09  4.99 R 5.26 5.29 5.38  Hemoglobin 16.8 15.2  15.4 R 16.4 16.3 16.2  Hematocrit 49.9 45.2  45.7 R 46.3 47.3 47.2  MCV 90 89  91.6 R 88 89 88  MCH 30.2 29.9  30.9 R 31.2 30.8 30.1  MCHC 33.7 33.6  33.7 R 35.4 34.5 34.3  RDW  12.6 13.0  12.5 R 12.2 12.1 13.0  Platelets 235 217  241 R 221 235 246  Neutrophils 48 46   59 55 49  Lymphs 39 40   31 35 39  Monocytes 9 9   7 7 9   Eos 3 4   2 2 2   Basos 1 1   1 1 1   Neutrophils Absolute 3.5 2.9   4.5 3.8 3.3  Lymphocytes Absolute 2.9 2.5   2.3 2.5 2.7  Monocytes Absolute 0.7 0.6   0.6 0.5 0.6  EOS (ABSOLUTE) 0.2 0.3   0.1 0.2 0.2  Basophils Absolute 0.1 0.1   0.1 0.1 0.1  Immature Granulocytes 0 0   0 0 0  Immature Grans (Abs) 0.0 0.0   0.0 0.0 0.0             ____________________________________________  EKG  Sinus rhythm at 63 bpm ____________________________________________    ____________________________________________   INITIAL IMPRESSION / ASSESSMENT AND PLAN / ED COURSE  As part of my medical decision making, I reviewed the following data within the electronic MEDICAL RECORD NUMBER       No acute findings on physical exam or EKG.  Labs shows mixed hyperlipidemia.  Patient will follow-up with his PCP for definitive evaluation and treatment.     ____________________________________________   FINAL CLINICAL IMPRESSION   ED Discharge Orders     None        Note:  This document was prepared using Dragon voice recognition software and may include unintentional dictation errors.

## 2023-10-06 DIAGNOSIS — M25571 Pain in right ankle and joints of right foot: Secondary | ICD-10-CM | POA: Diagnosis not present

## 2024-03-02 ENCOUNTER — Other Ambulatory Visit: Payer: Self-pay

## 2024-03-02 NOTE — Progress Notes (Unsigned)
Reconciled medication list.

## 2024-05-07 ENCOUNTER — Other Ambulatory Visit: Payer: Self-pay | Admitting: Physician Assistant

## 2024-05-07 DIAGNOSIS — I10 Essential (primary) hypertension: Secondary | ICD-10-CM

## 2024-05-07 NOTE — Telephone Encounter (Signed)
 No longer a patient of [facility]
